# Patient Record
Sex: Male | Born: 1950 | Hispanic: No | Marital: Single | Smoking: Former smoker
Health system: Southern US, Community
[De-identification: ages and names within clinical notes are randomized; demographics above are authoritative.]

## PROBLEM LIST (undated history)

## (undated) DIAGNOSIS — J189 Pneumonia, unspecified organism: Secondary | ICD-10-CM

## (undated) DIAGNOSIS — G4733 Obstructive sleep apnea (adult) (pediatric): Secondary | ICD-10-CM

## (undated) DIAGNOSIS — I10 Essential (primary) hypertension: Secondary | ICD-10-CM

## (undated) DIAGNOSIS — G709 Myoneural disorder, unspecified: Secondary | ICD-10-CM

## (undated) DIAGNOSIS — F329 Major depressive disorder, single episode, unspecified: Secondary | ICD-10-CM

## (undated) DIAGNOSIS — H409 Unspecified glaucoma: Secondary | ICD-10-CM

## (undated) DIAGNOSIS — G4700 Insomnia, unspecified: Secondary | ICD-10-CM

## (undated) DIAGNOSIS — E782 Mixed hyperlipidemia: Secondary | ICD-10-CM

## (undated) DIAGNOSIS — Z9989 Dependence on other enabling machines and devices: Secondary | ICD-10-CM

## (undated) DIAGNOSIS — M109 Gout, unspecified: Secondary | ICD-10-CM

## (undated) DIAGNOSIS — M1990 Unspecified osteoarthritis, unspecified site: Secondary | ICD-10-CM

## (undated) DIAGNOSIS — E119 Type 2 diabetes mellitus without complications: Secondary | ICD-10-CM

## (undated) DIAGNOSIS — F32A Depression, unspecified: Secondary | ICD-10-CM

## (undated) HISTORY — DX: Essential (primary) hypertension: I10

## (undated) HISTORY — PX: SPLENECTOMY: SUR1306

## (undated) HISTORY — DX: Type 2 diabetes mellitus without complications: E11.9

## (undated) HISTORY — PX: GANGLION CYST EXCISION: SHX1691

## (undated) HISTORY — DX: Mixed hyperlipidemia: E78.2

## (undated) HISTORY — PX: TONSILLECTOMY: SUR1361

## (undated) HISTORY — DX: Insomnia, unspecified: G47.00

## (undated) HISTORY — PX: CHOLECYSTECTOMY: SHX55

## (undated) HISTORY — DX: Obstructive sleep apnea (adult) (pediatric): G47.33

## (undated) HISTORY — DX: Depression, unspecified: F32.A

## (undated) HISTORY — DX: Dependence on other enabling machines and devices: Z99.89

## (undated) HISTORY — DX: Major depressive disorder, single episode, unspecified: F32.9

---

## 1978-04-07 HISTORY — PX: GANGLION CYST EXCISION: SHX1691

## 2002-04-07 HISTORY — PX: BACK SURGERY: SHX140

## 2006-08-31 ENCOUNTER — Inpatient Hospital Stay (HOSPITAL_COMMUNITY): Admission: AC | Admit: 2006-08-31 | Discharge: 2006-09-05 | Payer: Self-pay
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
## 2010-04-07 HISTORY — PX: COLONOSCOPY: SHX174

## 2016-08-19 ENCOUNTER — Telehealth: Payer: Self-pay

## 2016-08-19 NOTE — Telephone Encounter (Signed)
[phone]  PATIENT RECEIVED LETTER TO SCHEDULE TCS

## 2016-08-27 ENCOUNTER — Telehealth: Payer: Self-pay

## 2016-08-27 NOTE — Telephone Encounter (Signed)
Pt had called last week to be triaged. He asked for DS to call this number instead. [phone]

## 2016-08-27 NOTE — Telephone Encounter (Signed)
I have scheduled pt an OV with Walden Field, NP on 09/24/2016 at 3:00 pm. He has a hx of tubular adenoma.  I have called Robin at the [place] and she will fax over authorization number.

## 2016-08-27 NOTE — Telephone Encounter (Signed)
See note of 08/27/2016.

## 2016-08-28 NOTE — Telephone Encounter (Signed)
Received the Authorization # [id number].  Having Brandon Park scan it in also.

## 2016-09-19 NOTE — Telephone Encounter (Signed)
Pt called in to talk DS I told him that she was off and he said that he needs to cancel his appointment for Wednesday. I told him that we don't have anything until August and he said to see if she could call him Monday.

## 2016-09-22 NOTE — Telephone Encounter (Signed)
LMOM for Robin at the [facility] that appt has been changed and to let me know if the previous authorization number is sufficient or do I need another one.

## 2016-09-22 NOTE — Telephone Encounter (Signed)
Pt has to go out of town to [employer] for some work. I have rescheduled him to 11/17/2016 at 2:00 pm with Roseanne Kaufman, NP.

## 2016-09-23 NOTE — Telephone Encounter (Signed)
LMOM again for Brandon Park about the appt change and if prior authorization number is sufficient.

## 2016-09-23 NOTE — Telephone Encounter (Signed)
Brandon Park returned call and said she will send me updated into to ensure the time is covered in his authorization.

## 2016-09-24 ENCOUNTER — Ambulatory Visit: Payer: Self-pay | Admitting: Nurse Practitioner

## 2016-09-24 NOTE — Telephone Encounter (Addendum)
Received the fax. Authorization number is the same. Covers 6 months, office visits up to 3 and the procedure.  Manuela Schwartz will scan this one in also.

## 2016-11-17 ENCOUNTER — Other Ambulatory Visit: Payer: Self-pay

## 2016-11-17 ENCOUNTER — Encounter: Payer: Self-pay | Admitting: Gastroenterology

## 2016-11-17 ENCOUNTER — Ambulatory Visit (INDEPENDENT_AMBULATORY_CARE_PROVIDER_SITE_OTHER): Payer: Non-veteran care | Admitting: Gastroenterology

## 2016-11-17 DIAGNOSIS — Z86010 Personal history of colonic polyps: Secondary | ICD-10-CM | POA: Diagnosis not present

## 2016-11-17 NOTE — Patient Instructions (Signed)
We have scheduled you for a colonoscopy with Dr. Oneida Alar in the near future.  Do not take metformin the day of the procedure.
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
## 2016-11-18 NOTE — Assessment & Plan Note (Signed)
66 year old male with history of adenoma in 2012 with recommendations for 5-year-surveillance per PCP notes. Requesting procedure notes if possible from outside facility. No concerning lower or upper GI symptoms.  Proceed with colonoscopy with Dr. Oneida Alar in the near future. The risks, benefits, and alternatives have been discussed in detail with the patient. They state understanding and desire to proceed.  Propofol due to polypharmacy  No metformin day of procedure

## 2016-11-18 NOTE — Progress Notes (Signed)
cc'ed to pcp °

## 2016-11-28 NOTE — Patient Instructions (Signed)
Brandon Park  11/28/2016     @PREFPERIOPPHARMACY @   Your procedure is scheduled on  12/09/2016   Report to [facility] at  745  A.M.  Call this number if you have problems the morning of surgery:  [phone]   Remember:  Do not eat food or drink liquids after midnight.  Take these medicines the morning of surgery with A SIP OF WATER  Lotensin, wellbutrin, metoprolol, zoloft.   Do not wear jewelry, make-up or nail polish.  Do not wear lotions, powders, or perfumes, or deoderant.  Do not shave 48 hours prior to surgery.  Men may shave face and neck.  Do not bring valuables to the hospital.  [facility] is not responsible for any belongings or valuables.  Contacts, dentures or bridgework may not be worn into surgery.  Leave your suitcase in the car.  After surgery it may be brought to your room.  For patients admitted to the hospital, discharge time will be determined by your treatment team.  Patients discharged the day of surgery will not be allowed to drive home.   Name and phone number of your driver:  family Special instructions:  Follow the diet and prep instructions given to you by Dr Nona Dell office.  Please read over the following fact sheets that you were given. Anesthesia Post-op Instructions and Care and Recovery After Surgery       Colonoscopy, Adult A colonoscopy is an exam to look at the entire large intestine. During the exam, a lubricated, bendable tube is inserted into the anus and then passed into the rectum, colon, and other parts of the large intestine. A colonoscopy is often done as a part of normal colorectal screening or in response to certain symptoms, such as anemia, persistent diarrhea, abdominal pain, and blood in the stool. The exam can help screen for and diagnose medical problems, including:  Tumors.  Polyps.  Inflammation.  Areas of bleeding.  Tell a health care provider about:  Any allergies you have.  All medicines  you are taking, including vitamins, herbs, eye drops, creams, and over-the-counter medicines.  Any problems you or family members have had with anesthetic medicines.  Any blood disorders you have.  Any surgeries you have had.  Any medical conditions you have.  Any problems you have had passing stool. What are the risks? Generally, this is a safe procedure. However, problems may occur, including:  Bleeding.  A tear in the intestine.  A reaction to medicines given during the exam.  Infection (rare).  What happens before the procedure? Eating and drinking restrictions Follow instructions from your health care provider about eating and drinking, which may include:  A few days before the procedure - follow a low-fiber diet. Avoid nuts, seeds, dried fruit, raw fruits, and vegetables.  1-3 days before the procedure - follow a clear liquid diet. Drink only clear liquids, such as clear broth or bouillon, black coffee or tea, clear juice, clear soft drinks or sports drinks, gelatin dessert, and popsicles. Avoid any liquids that contain red or purple dye.  On the day of the procedure - do not eat or drink anything during the 2 hours before the procedure, or within the time period that your health care provider recommends.  Bowel prep If you were prescribed an oral bowel prep to clean out your colon:  Take it as told by your health care provider. Starting the day before your procedure, you  will need to drink a large amount of medicated liquid. The liquid will cause you to have multiple loose stools until your stool is almost clear or light green.  If your skin or anus gets irritated from diarrhea, you may use these to relieve the irritation: ? Medicated wipes, such as adult wet wipes with aloe and vitamin E. ? A skin soothing-product like petroleum jelly.  If you vomit while drinking the bowel prep, take a break for up to 60 minutes and then begin the bowel prep again. If vomiting  continues and you cannot take the bowel prep without vomiting, call your health care provider.  General instructions  Ask your health care provider about changing or stopping your regular medicines. This is especially important if you are taking diabetes medicines or blood thinners.  Plan to have someone take you home from the hospital or clinic. What happens during the procedure?  An IV tube may be inserted into one of your veins.  You will be given medicine to help you relax (sedative).  To reduce your risk of infection: ? Your health care team will wash or sanitize their hands. ? Your anal area will be washed with soap.  You will be asked to lie on your side with your knees bent.  Your health care provider will lubricate a long, thin, flexible tube. The tube will have a camera and a light on the end.  The tube will be inserted into your anus.  The tube will be gently eased through your rectum and colon.  Air will be delivered into your colon to keep it open. You may feel some pressure or cramping.  The camera will be used to take images during the procedure.  A small tissue sample may be removed from your body to be examined under a microscope (biopsy). If any potential problems are found, the tissue will be sent to a lab for testing.  If small polyps are found, your health care provider may remove them and have them checked for cancer cells.  The tube that was inserted into your anus will be slowly removed. The procedure may vary among health care providers and hospitals. What happens after the procedure?  Your blood pressure, heart rate, breathing rate, and blood oxygen level will be monitored until the medicines you were given have worn off.  Do not drive for 24 hours after the exam.  You may have a small amount of blood in your stool.  You may pass gas and have mild abdominal cramping or bloating due to the air that was used to inflate your colon during the  exam.  It is up to you to get the results of your procedure. Ask your health care provider, or the department performing the procedure, when your results will be ready. This information is not intended to replace advice given to you by your health care provider. Make sure you discuss any questions you have with your health care provider. Document Released: 03/21/2000 Document Revised: 01/23/2016 Document Reviewed: 06/05/2015 Elsevier Interactive Patient Education  2018 [employer].  Colonoscopy, Adult, Care After This sheet gives you information about how to care for yourself after your procedure. Your health care provider may also give you more specific instructions. If you have problems or questions, contact your health care provider. What can I expect after the procedure? After the procedure, it is common to have:  A small amount of blood in your stool for 24 hours after the procedure.  Some  gas.  Mild abdominal cramping or bloating.  Follow these instructions at home: General instructions   For the first 24 hours after the procedure: ? Do not drive or use machinery. ? Do not sign important documents. ? Do not drink alcohol. ? Do your regular daily activities at a slower pace than normal. ? Eat soft, easy-to-digest foods. ? Rest often.  Take over-the-counter or prescription medicines only as told by your health care provider.  It is up to you to get the results of your procedure. Ask your health care provider, or the department performing the procedure, when your results will be ready. Relieving cramping and bloating  Try walking around when you have cramps or feel bloated.  Apply heat to your abdomen as told by your health care provider. Use a heat source that your health care provider recommends, such as a moist heat pack or a heating pad. ? Place a towel between your skin and the heat source. ? Leave the heat on for 20-30 minutes. ? Remove the heat if your skin turns  bright red. This is especially important if you are unable to feel pain, heat, or cold. You may have a greater risk of getting burned. Eating and drinking  Drink enough fluid to keep your urine clear or pale yellow.  Resume your normal diet as instructed by your health care provider. Avoid heavy or fried foods that are hard to digest.  Avoid drinking alcohol for as long as instructed by your health care provider. Contact a health care provider if:  You have blood in your stool 2-3 days after the procedure. Get help right away if:  You have more than a small spotting of blood in your stool.  You pass large blood clots in your stool.  Your abdomen is swollen.  You have nausea or vomiting.  You have a fever.  You have increasing abdominal pain that is not relieved with medicine. This information is not intended to replace advice given to you by your health care provider. Make sure you discuss any questions you have with your health care provider. Document Released: 11/06/2003 Document Revised: 12/17/2015 Document Reviewed: 06/05/2015 Elsevier Interactive Patient Education  2018 [place] Anesthesia is a term that refers to techniques, procedures, and medicines that help a person stay safe and comfortable during a medical procedure. Monitored anesthesia care, or sedation, is one type of anesthesia. Your anesthesia specialist may recommend sedation if you will be having a procedure that does not require you to be unconscious, such as:  Cataract surgery.  A dental procedure.  A biopsy.  A colonoscopy.  During the procedure, you may receive a medicine to help you relax (sedative). There are three levels of sedation:  Mild sedation. At this level, you may feel awake and relaxed. You will be able to follow directions.  Moderate sedation. At this level, you will be sleepy. You may not remember the procedure.  Deep sedation. At this level, you will be  asleep. You will not remember the procedure.  The more medicine you are given, the deeper your level of sedation will be. Depending on how you respond to the procedure, the anesthesia specialist may change your level of sedation or the type of anesthesia to fit your needs. An anesthesia specialist will monitor you closely during the procedure. Let your health care provider know about:  Any allergies you have.  All medicines you are taking, including vitamins, herbs, eye drops, creams, and over-the-counter  medicines.  Any use of steroids (by mouth or as a cream).  Any problems you or family members have had with sedatives and anesthetic medicines.  Any blood disorders you have.  Any surgeries you have had.  Any medical conditions you have, such as sleep apnea.  Whether you are pregnant or may be pregnant.  Any use of cigarettes, alcohol, or street drugs. What are the risks? Generally, this is a safe procedure. However, problems may occur, including:  Getting too much medicine (oversedation).  Nausea.  Allergic reaction to medicines.  Trouble breathing. If this happens, a breathing tube may be used to help with breathing. It will be removed when you are awake and breathing on your own.  Heart trouble.  Lung trouble.  Before the procedure Staying hydrated Follow instructions from your health care provider about hydration, which may include:  Up to 2 hours before the procedure - you may continue to drink clear liquids, such as water, clear fruit juice, black coffee, and plain tea.  Eating and drinking restrictions Follow instructions from your health care provider about eating and drinking, which may include:  8 hours before the procedure - stop eating heavy meals or foods such as meat, fried foods, or fatty foods.  6 hours before the procedure - stop eating light meals or foods, such as toast or cereal.  6 hours before the procedure - stop drinking milk or drinks that  contain milk.  2 hours before the procedure - stop drinking clear liquids.  Medicines Ask your health care provider about:  Changing or stopping your regular medicines. This is especially important if you are taking diabetes medicines or blood thinners.  Taking medicines such as aspirin and ibuprofen. These medicines can thin your blood. Do not take these medicines before your procedure if your health care provider instructs you not to.  Tests and exams  You will have a physical exam.  You may have blood tests done to show: ? How well your kidneys and liver are working. ? How well your blood can clot.  General instructions  Plan to have someone take you home from the hospital or clinic.  If you will be going home right after the procedure, plan to have someone with you for 24 hours.  What happens during the procedure?  Your blood pressure, heart rate, breathing, level of pain and overall condition will be monitored.  An IV tube will be inserted into one of your veins.  Your anesthesia specialist will give you medicines as needed to keep you comfortable during the procedure. This may mean changing the level of sedation.  The procedure will be performed. After the procedure  Your blood pressure, heart rate, breathing rate, and blood oxygen level will be monitored until the medicines you were given have worn off.  Do not drive for 24 hours if you received a sedative.  You may: ? Feel sleepy, clumsy, or nauseous. ? Feel forgetful about what happened after the procedure. ? Have a sore throat if you had a breathing tube during the procedure. ? Vomit. This information is not intended to replace advice given to you by your health care provider. Make sure you discuss any questions you have with your health care provider. Document Released: 12/18/2004 Document Revised: 08/31/2015 Document Reviewed: 07/15/2015 Elsevier Interactive Patient Education  2018 [place], Care After These instructions provide you with information about caring for yourself after your procedure. Your health care provider may also give  you more specific instructions. Your treatment has been planned according to current medical practices, but problems sometimes occur. Call your health care provider if you have any problems or questions after your procedure. What can I expect after the procedure? After your procedure, it is common to:  Feel sleepy for several hours.  Feel clumsy and have poor balance for several hours.  Feel forgetful about what happened after the procedure.  Have poor judgment for several hours.  Feel nauseous or vomit.  Have a sore throat if you had a breathing tube during the procedure.  Follow these instructions at home: For at least 24 hours after the procedure:   Do not: ? Participate in activities in which you could fall or become injured. ? Drive. ? Use heavy machinery. ? Drink alcohol. ? Take sleeping pills or medicines that cause drowsiness. ? Make important decisions or sign legal documents. ? Take care of children on your own.  Rest. Eating and drinking  Follow the diet that is recommended by your health care provider.  If you vomit, drink water, juice, or soup when you can drink without vomiting.  Make sure you have little or no nausea before eating solid foods. General instructions  Have a responsible adult stay with you until you are awake and alert.  Take over-the-counter and prescription medicines only as told by your health care provider.  If you smoke, do not smoke without supervision.  Keep all follow-up visits as told by your health care provider. This is important. Contact a health care provider if:  You keep feeling nauseous or you keep vomiting.  You feel light-headed.  You develop a rash.  You have a fever. Get help right away if:  You have trouble breathing. This information is not  intended to replace advice given to you by your health care provider. Make sure you discuss any questions you have with your health care provider. Document Released: 07/15/2015 Document Revised: 11/14/2015 Document Reviewed: 07/15/2015 Elsevier Interactive Patient Education  [employer].

## 2016-12-03 ENCOUNTER — Encounter (HOSPITAL_COMMUNITY): Payer: Self-pay

## 2016-12-03 ENCOUNTER — Encounter (HOSPITAL_COMMUNITY)
Admission: RE | Admit: 2016-12-03 | Discharge: 2016-12-03 | Disposition: A | Discharge status: Home / Self Care | Payer: Non-veteran care | Source: Ambulatory Visit | Attending: Gastroenterology | Admitting: Gastroenterology

## 2016-12-03 ENCOUNTER — Telehealth: Payer: Self-pay | Admitting: Gastroenterology

## 2016-12-03 DIAGNOSIS — R9431 Abnormal electrocardiogram [ECG] [EKG]: Secondary | ICD-10-CM | POA: Diagnosis not present

## 2016-12-03 DIAGNOSIS — Z01818 Encounter for other preprocedural examination: Secondary | ICD-10-CM | POA: Diagnosis present

## 2016-12-03 HISTORY — DX: Unspecified osteoarthritis, unspecified site: M19.90

## 2016-12-03 HISTORY — DX: Unspecified glaucoma: H40.9

## 2016-12-03 HISTORY — DX: Gout, unspecified: M10.9

## 2016-12-03 LAB — CBC WITH DIFFERENTIAL/PLATELET
Basophils Absolute: 0 10*3/uL (ref 0.0–0.1)
Basophils Relative: 0 %
EOS PCT: 2 %
Eosinophils Absolute: 0.2 10*3/uL (ref 0.0–0.7)
HCT: 46.7 % (ref 39.0–52.0)
Hemoglobin: 14.2 g/dL (ref 13.0–17.0)
LYMPHS ABS: 3.6 10*3/uL (ref 0.7–4.0)
Lymphocytes Relative: 32 %
MCH: 29.5 pg (ref 26.0–34.0)
MCHC: 30.4 g/dL (ref 30.0–36.0)
MCV: 96.9 fL (ref 78.0–100.0)
MONOS PCT: 8 %
Monocytes Absolute: 0.9 10*3/uL (ref 0.1–1.0)
Neutro Abs: 6.4 10*3/uL (ref 1.7–7.7)
Neutrophils Relative %: 58 %
PLATELETS: 266 10*3/uL (ref 150–400)
RBC: 4.82 MIL/uL (ref 4.22–5.81)
RDW: 14 % (ref 11.5–15.5)
WBC: 11.1 10*3/uL — AB (ref 4.0–10.5)

## 2016-12-03 LAB — BASIC METABOLIC PANEL
Anion gap: 12 (ref 5–15)
BUN: 13 mg/dL (ref 6–20)
CHLORIDE: 102 mmol/L (ref 101–111)
CO2: 24 mmol/L (ref 22–32)
Calcium: 9.5 mg/dL (ref 8.9–10.3)
Creatinine, Ser: 1.21 mg/dL (ref 0.61–1.24)
GFR calc Af Amer: >60 mL/min (ref >60)
GLUCOSE: 188 mg/dL — AB (ref 65–99)
POTASSIUM: 3.2 mmol/L — AB (ref 3.5–5.1)
Sodium: 138 mmol/L (ref 135–145)

## 2016-12-03 MED ORDER — POTASSIUM CHLORIDE ER 20 MEQ PO TBCR: EXTENDED_RELEASE_TABLET | ORAL | 0 refills | Status: DC

## 2016-12-03 NOTE — Telephone Encounter (Signed)
PLEASE CALL PT. HE HAS A LOW POTASSIUM. RX SENT FOR KCL 40 mEq BID ON AUG 30 THEN 40 MEq DAILY FOR FOR 3 DAYS.RESUME HIS ROUTINE KCL MEDS ON SEP 4. HIS ECG SUGGESTS HE MAY HAVE CORONARY ARTERY DISEASE. HE SHOULD SEE A HEART DOCTOR TO DISCUSS THE CHANGES ON HIS ECG.

## 2016-12-04 NOTE — Telephone Encounter (Signed)
Pt is aware to start the K+ 40 meq today and continue for 3 days. [facility]

## 2016-12-08 ENCOUNTER — Telehealth: Payer: Self-pay | Admitting: Gastroenterology

## 2016-12-08 NOTE — Telephone Encounter (Signed)
PT AWARE[facility].

## 2016-12-09 ENCOUNTER — Encounter (HOSPITAL_COMMUNITY): Payer: Self-pay | Admitting: *Deleted

## 2016-12-09 ENCOUNTER — Ambulatory Visit (HOSPITAL_COMMUNITY): Payer: Non-veteran care | Admitting: Anesthesiology

## 2016-12-09 ENCOUNTER — Ambulatory Visit (HOSPITAL_COMMUNITY)
Admission: RE | Admit: 2016-12-09 | Discharge: 2016-12-09 | Disposition: A | Discharge status: Home / Self Care | Payer: Non-veteran care | Source: Ambulatory Visit | Attending: Gastroenterology | Admitting: Gastroenterology

## 2016-12-09 ENCOUNTER — Encounter (HOSPITAL_COMMUNITY)
Admission: RE | Disposition: A | Discharge status: Home / Self Care | Payer: Self-pay | Source: Ambulatory Visit | Attending: Gastroenterology

## 2016-12-09 DIAGNOSIS — K648 Other hemorrhoids: Secondary | ICD-10-CM | POA: Insufficient documentation

## 2016-12-09 DIAGNOSIS — D124 Benign neoplasm of descending colon: Secondary | ICD-10-CM

## 2016-12-09 DIAGNOSIS — K5730 Diverticulosis of large intestine without perforation or abscess without bleeding: Secondary | ICD-10-CM | POA: Insufficient documentation

## 2016-12-09 DIAGNOSIS — Z7984 Long term (current) use of oral hypoglycemic drugs: Secondary | ICD-10-CM | POA: Insufficient documentation

## 2016-12-09 DIAGNOSIS — M109 Gout, unspecified: Secondary | ICD-10-CM | POA: Insufficient documentation

## 2016-12-09 DIAGNOSIS — I10 Essential (primary) hypertension: Secondary | ICD-10-CM | POA: Diagnosis not present

## 2016-12-09 DIAGNOSIS — Z87891 Personal history of nicotine dependence: Secondary | ICD-10-CM | POA: Insufficient documentation

## 2016-12-09 DIAGNOSIS — E7800 Pure hypercholesterolemia, unspecified: Secondary | ICD-10-CM | POA: Insufficient documentation

## 2016-12-09 DIAGNOSIS — Z1211 Encounter for screening for malignant neoplasm of colon: Secondary | ICD-10-CM | POA: Insufficient documentation

## 2016-12-09 DIAGNOSIS — Z7982 Long term (current) use of aspirin: Secondary | ICD-10-CM | POA: Diagnosis not present

## 2016-12-09 DIAGNOSIS — F329 Major depressive disorder, single episode, unspecified: Secondary | ICD-10-CM | POA: Diagnosis not present

## 2016-12-09 DIAGNOSIS — E119 Type 2 diabetes mellitus without complications: Secondary | ICD-10-CM | POA: Insufficient documentation

## 2016-12-09 DIAGNOSIS — Z8371 Family history of colonic polyps: Secondary | ICD-10-CM | POA: Diagnosis not present

## 2016-12-09 DIAGNOSIS — G4700 Insomnia, unspecified: Secondary | ICD-10-CM | POA: Diagnosis not present

## 2016-12-09 DIAGNOSIS — K644 Residual hemorrhoidal skin tags: Secondary | ICD-10-CM | POA: Insufficient documentation

## 2016-12-09 DIAGNOSIS — Z86010 Personal history of colonic polyps: Secondary | ICD-10-CM | POA: Insufficient documentation

## 2016-12-09 DIAGNOSIS — D123 Benign neoplasm of transverse colon: Secondary | ICD-10-CM | POA: Diagnosis not present

## 2016-12-09 HISTORY — PX: COLONOSCOPY WITH PROPOFOL: SHX5780

## 2016-12-09 HISTORY — PX: POLYPECTOMY: SHX5525

## 2016-12-09 LAB — GLUCOSE, CAPILLARY
GLUCOSE-CAPILLARY: 176 mg/dL — AB (ref 65–99)
GLUCOSE-CAPILLARY: 163 mg/dL — AB (ref 65–99)

## 2016-12-09 SURGERY — COLONOSCOPY WITH PROPOFOL
Anesthesia: Monitor Anesthesia Care

## 2016-12-09 MED ORDER — MIDAZOLAM HCL 2 MG/2ML IJ SOLN
1.0000 mg | INTRAMUSCULAR | Status: AC
  Administered 2016-12-09: 2 mg via INTRAVENOUS

## 2016-12-09 MED ORDER — CHLORHEXIDINE GLUCONATE CLOTH 2 % EX PADS: 6.0000 | MEDICATED_PAD | Freq: Once | CUTANEOUS | Status: DC

## 2016-12-09 MED ORDER — LACTATED RINGERS IV SOLN
INTRAVENOUS | Status: DC
  Administered 2016-12-09: 09:00:00 via INTRAVENOUS

## 2016-12-09 MED ORDER — FENTANYL CITRATE (PF) 100 MCG/2ML IJ SOLN
25.0000 ug | Freq: Once | INTRAMUSCULAR | Status: AC
  Administered 2016-12-09: 25 ug via INTRAVENOUS

## 2016-12-09 MED ORDER — PROPOFOL 500 MG/50ML IV EMUL
INTRAVENOUS | Status: DC | PRN
  Administered 2016-12-09: 150 ug/kg/min via INTRAVENOUS
  Administered 2016-12-09: 09:00:00 via INTRAVENOUS

## 2016-12-09 MED ORDER — FENTANYL CITRATE (PF) 100 MCG/2ML IJ SOLN
INTRAMUSCULAR | Status: AC
  Filled 2016-12-09: qty 2

## 2016-12-09 MED ORDER — STERILE WATER FOR IRRIGATION IR SOLN
Status: DC | PRN
  Administered 2016-12-09: 100 mL

## 2016-12-09 MED ORDER — MIDAZOLAM HCL 2 MG/2ML IJ SOLN
INTRAMUSCULAR | Status: AC
  Filled 2016-12-09: qty 2

## 2016-12-09 NOTE — Anesthesia Postprocedure Evaluation (Signed)
Anesthesia Post Note  Patient: Brandon Park  Procedure(s) Performed: Procedure(s) (LRB): COLONOSCOPY WITH PROPOFOL (N/A) POLYPECTOMY  Patient location during evaluation: PACU Anesthesia Type: MAC Level of consciousness: awake and alert, oriented and patient cooperative Pain management: pain level controlled Vital Signs Assessment: post-procedure vital signs reviewed and stable Respiratory status: spontaneous breathing Cardiovascular status: stable Postop Assessment: no signs of nausea or vomiting Anesthetic complications: no     Last Vitals:  Vitals:   12/09/16 0855 12/09/16 0900  BP: (!) 141/84 (!) 143/87  Pulse:    Resp: 17 13  Temp:    SpO2: 95% 94%    Last Pain:  Vitals:   12/09/16 0822  TempSrc: Oral                 [name] A

## 2016-12-09 NOTE — Transfer of Care (Signed)
Immediate Anesthesia Transfer of Care Note  Patient: Brandon Park  Procedure(s) Performed: Procedure(s) with comments: COLONOSCOPY WITH PROPOFOL (N/A) - 915 POLYPECTOMY - transverse colon polyp, descending colon polyps x2  Patient Location: PACU  Anesthesia Type:MAC  Level of Consciousness: awake, alert , oriented and patient cooperative  Airway & Oxygen Therapy: Patient Spontanous Breathing  Post-op Assessment: Report given to RN and Post -op Vital signs reviewed and stable  Post vital signs: Reviewed and stable  Last Vitals:  Vitals:   12/09/16 0855 12/09/16 0900  BP: (!) 141/84 (!) 143/87  Pulse:    Resp: 17 13  Temp:    SpO2: 95% 94%    Last Pain:  Vitals:   12/09/16 0822  TempSrc: Oral      Patients Stated Pain Goal: 6 (97/67/34 1937)  Complications: No apparent anesthesia complications
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
## 2016-12-09 NOTE — Op Note (Signed)
[facility] Patient Name: Brandon Park Procedure Date: 12/09/2016 8:48 AM MRN: [id number] Date of Birth: 1951/01/27 Attending MD: Barney Drain MD, MD CSN: [id number] Age: 66 Admit Type: Outpatient Procedure:                Colonoscopy with cold snare AND COLD FORCEPS                            POLYPECTOMY Indications:              High risk colon cancer surveillance: Personal                            history of colonic polyps Providers:                Barney Drain MD, MD, Rosina Lowenstein, RN, Aram Candela Referring MD:             [facility] Medicines:                Propofol per Anesthesia Complications:            No immediate complications. Estimated Blood Loss:     Estimated blood loss was minimal. Procedure:                Pre-Anesthesia Assessment:                           - Prior to the procedure, a History and Physical                            was performed, and patient medications and                            allergies were reviewed. The patient's tolerance of                            previous anesthesia was also reviewed. The risks                            and benefits of the procedure and the sedation                            options and risks were discussed with the patient.                            All questions were answered, and informed consent                            was obtained. Prior Anticoagulants: The patient                            last took aspirin 1 day prior to the procedure. ASA                            Grade Assessment: II - A patient with mild systemic  disease. After reviewing the risks and benefits,                            the patient was deemed in satisfactory condition to                            undergo the procedure. After obtaining informed                            consent, the colonoscope was passed under direct                            vision. Throughout the procedure, the patient's                             blood pressure, pulse, and oxygen saturations were                            monitored continuously. The EC-[id number] ([id number])                            scope was introduced through the anus and advanced                            to the the cecum, identified by appendiceal orifice                            and ileocecal valve. The colonoscopy was somewhat                            difficult due to a tortuous colon. Successful                            completion of the procedure was aided by COLOWRAP.                            The patient tolerated the procedure well. The                            quality of the bowel preparation was good. The                            ileocecal valve, appendiceal orifice, and rectum                            were photographed. Scope In: 9:16:39 AM Scope Out: 9:40:03 AM Scope Withdrawal Time: 0 hours 20 minutes 33 seconds  Total Procedure Duration: 0 hours 23 minutes 24 seconds  Findings:      A 3 mm polyp was found in the mid transverse colon. The polyp was       sessile. The polyp was removed with a cold biopsy forceps. Resection and       retrieval were complete.      Three sessile polyps were found in the  proximal descending colon. The       polyps were 4 to 6 mm in size. These polyps were removed with a cold       snare. Resection and retrieval were complete. Coagulation for hemostasis       of bleeding caused by the procedure using snare was successful.      Multiple small and large-mouthed diverticula were found in the sigmoid       colon, descending colon and ascending colon.      External and internal hemorrhoids were found during retroflexion. The       hemorrhoids were moderate. Impression:               - One 3 mm polyp in the mid transverse colon,                            removed with a cold biopsy forceps. Resected and                            retrieved.                           - Three 4 to 6 mm  polyps in the proximal descending                            colon, removed with a cold snare. Resected and                            retrieved. Treated with a hot snare.                           - Diverticulosis in the sigmoid colon, in the                            descending colon and in the ascending colon.                           - External and internal hemorrhoids. Moderate Sedation:      Per Anesthesia Care Recommendation:           - Repeat colonoscopy in 3 years for surveillance.                           - High fiber diet.                           - Continue present medications.                           - Await pathology results.                           - Patient has a contact number available for                            emergencies. The signs and symptoms of potential  delayed complications were discussed with the                            patient. Return to normal activities tomorrow.                            Written discharge instructions were provided to the                            patient. Procedure Code(s):        --- Professional ---                           [phone], Colonoscopy, flexible; with removal of                            tumor(s), polyp(s), or other lesion(s) by snare                            technique                           45380, 63, Colonoscopy, flexible; with biopsy,                            single or multiple Diagnosis Code(s):        --- Professional ---                           Z86.010, Personal history of colonic polyps                           D12.3, Benign neoplasm of transverse colon (hepatic                            flexure or splenic flexure)                           D12.4, Benign neoplasm of descending colon                           K64.8, Other hemorrhoids                           K57.30, Diverticulosis of large intestine without                            perforation or abscess without  bleeding CPT copyright 2016 [employer]. All rights reserved. The codes documented in this report are preliminary and upon coder review may  be revised to meet current compliance requirements. Barney Drain, MD Barney Drain MD, MD 12/09/2016 9:59:56 AM This report has been signed electronically. Number of Addenda: 0

## 2016-12-09 NOTE — Anesthesia Procedure Notes (Signed)
Procedure Name: MAC Date/Time: 12/09/2016 9:05 AM Performed by: Andree Elk, Haleem Hanner A Pre-anesthesia Checklist: Patient identified, Emergency Drugs available, Suction available, Patient being monitored and Timeout performed Oxygen Delivery Method: Simple face mask

## 2016-12-09 NOTE — Anesthesia Preprocedure Evaluation (Signed)
Anesthesia Evaluation  Patient identified by MRN, date of birth, ID band Patient awake    Reviewed: Allergy & Precautions, NPO status , Patient's Chart, lab work & pertinent test results  Airway Mallampati: I  TM Distance: >3 FB Neck ROM: Full    Dental  (+) Teeth Intact   Pulmonary former smoker,    breath sounds clear to auscultation       Cardiovascular hypertension, Pt. on medications  Rhythm:Regular Rate:Normal     Neuro/Psych PSYCHIATRIC DISORDERS Depression negative neurological ROS     GI/Hepatic   Endo/Other  diabetes, Type 2  Renal/GU      Musculoskeletal  (+) Arthritis ,   Abdominal   Peds  Hematology   Anesthesia Other Findings   Reproductive/Obstetrics                             Anesthesia Physical Anesthesia Plan  ASA: II  Anesthesia Plan: MAC   Post-op Pain Management:    Induction: Intravenous  PONV Risk Score and Plan:   Airway Management Planned: Simple Face Mask  Additional Equipment:   Intra-op Plan:   Post-operative Plan:   Informed Consent: I have reviewed the patients History and Physical, chart, labs and discussed the procedure including the risks, benefits and alternatives for the proposed anesthesia with the patient or authorized representative who has indicated his/her understanding and acceptance.     Plan Discussed with:   Anesthesia Plan Comments:         Anesthesia Quick Evaluation

## 2016-12-09 NOTE — Discharge Instructions (Signed)
You have MODERATE internal AND EXTERNAL hemorrhoids. YOU HAVE diverticulosis IN YOUR RIGHT AND LEFT COLON. YOU HAD FOUR POLYPS REMOVED.    DRINK WATER TO KEEP YOUR URINE LIGHT YELLOW.  FOLLOW A HIGH FIBER DIET. AVOID ITEMS THAT CAUSE BLOATING. See info below.  CONTINUE YOUR WEIGHT LOSS EFFORTS. LOSE TWENTY POUNDS.  WHILE I DO NOT WANT TO ALARM YOU, YOUR BODY MASS INDEX (BMI) IS OVER 30 WHICH MEANS YOU ARE OBESE. OBESITY ACTIVATES CANCER GENES. OBESITY IS ASSOCIATED WITH AN INCREASE RISK FOR ALL CANCERS, INCLUDING ESOPHAGEAL AND COLON CANCER. A WEIGHT OF 200 LBS WILL GET YOUR BM LESS THAN 30.   USE PREPARATION H FOUR TIMES  A DAY IF NEEDED TO RELIEVE RECTAL PAIN/PRESSURE/BLEEDING. RESTRICT USE OF HYDROCORTISONE SUPPOSITORIES TO 3-4 TIMES A YEAR. IF CREAMS AND SUPPOSITORIES CANNOT CONTROL YOUR SYMPTOMS, YOU SHOULD CONSIDER SURGERY TO FIX YOUR HEMORRHOIDS.  YOUR BIOPSY RESULTS WILL BE AVAILABLE IN MY CHART  SEP 7 AND MY OFFICE WILL CONTACT YOU IN 10-14 DAYS WITH YOUR RESULTS.   Next colonoscopy in 3 years.  Colonoscopy Care After Read the instructions outlined below and refer to this sheet in the next week. These discharge instructions provide you with general information on caring for yourself after you leave the hospital. While your treatment has been planned according to the most current medical practices available, unavoidable complications occasionally occur. If you have any problems or questions after discharge, call DR. Dashanique Brownstein, [phone].  ACTIVITY  You may resume your regular activity, but move at a slower pace for the next 24 hours.   Take frequent rest periods for the next 24 hours.   Walking will help get rid of the air and reduce the bloated feeling in your belly (abdomen).   No driving for 24 hours (because of the medicine (anesthesia) used during the test).   You may shower.   Do not sign any important legal documents or operate any machinery for 24 hours (because of the  anesthesia used during the test).    NUTRITION  Drink plenty of fluids.   You may resume your normal diet as instructed by your doctor.   Begin with a light meal and progress to your normal diet. Heavy or fried foods are harder to digest and may make you feel sick to your stomach (nauseated).   Avoid alcoholic beverages for 24 hours or as instructed.    MEDICATIONS  You may resume your normal medications.   WHAT YOU CAN EXPECT TODAY  Some feelings of bloating in the abdomen.   Passage of more gas than usual.   Spotting of blood in your stool or on the toilet paper  .  IF YOU HAD POLYPS REMOVED DURING THE COLONOSCOPY:  Eat a soft diet IF YOU HAVE NAUSEA, BLOATING, ABDOMINAL PAIN, OR VOMITING.    FINDING OUT THE RESULTS OF YOUR TEST Not all test results are available during your visit. DR. Oneida Alar WILL CALL YOU WITHIN 14 DAYS OF YOUR PROCEDUE WITH YOUR RESULTS. Do not assume everything is normal if you have not heard from DR. Jordanna Hendrie, CALL HER OFFICE AT [phone].  SEEK IMMEDIATE MEDICAL ATTENTION AND CALL THE OFFICE: [phone] IF:  You have more than a spotting of blood in your stool.   Your belly is swollen (abdominal distention).   You are nauseated or vomiting.   You have a temperature over 101F.   You have abdominal pain or discomfort that is severe or gets worse throughout the day.  High-Fiber Diet A high-fiber  diet changes your normal diet to include more whole grains, legumes, fruits, and vegetables. Changes in the diet involve replacing refined carbohydrates with unrefined foods. The calorie level of the diet is essentially unchanged. The Dietary Reference Intake (recommended amount) for adult males is 38 grams per day. For adult females, it is 25 grams per day. Pregnant and lactating women should consume 28 grams of fiber per day. Fiber is the intact part of a plant that is not broken down during digestion. Functional fiber is fiber that has been isolated  from the plant to provide a beneficial effect in the body. PURPOSE  Increase stool bulk.   Ease and regulate bowel movements.   Lower cholesterol.  REDUCE RISK OF COLON CANCER  INDICATIONS THAT YOU NEED MORE FIBER  Constipation and hemorrhoids.   Uncomplicated diverticulosis (intestine condition) and irritable bowel syndrome.   Weight management.   As a protective measure against hardening of the arteries (atherosclerosis), diabetes, and cancer.   GUIDELINES FOR INCREASING FIBER IN THE DIET  Start adding fiber to the diet slowly. A gradual increase of about 5 more grams (2 slices of whole-wheat bread, 2 servings of most fruits or vegetables, or 1 bowl of high-fiber cereal) per day is best. Too rapid an increase in fiber may result in constipation, flatulence, and bloating.   Drink enough water and fluids to keep your urine clear or pale yellow. Water, juice, or caffeine-free drinks are recommended. Not drinking enough fluid may cause constipation.   Eat a variety of high-fiber foods rather than one type of fiber.   Try to increase your intake of fiber through using high-fiber foods rather than fiber pills or supplements that contain small amounts of fiber.   The goal is to change the types of food eaten. Do not supplement your present diet with high-fiber foods, but replace foods in your present diet.   INCLUDE A VARIETY OF FIBER SOURCES  Replace refined and processed grains with whole grains, canned fruits with fresh fruits, and incorporate other fiber sources. White rice, white breads, and most bakery goods contain little or no fiber.   Brown whole-grain rice, buckwheat oats, and many fruits and vegetables are all good sources of fiber. These include: broccoli, Brussels sprouts, cabbage, cauliflower, beets, sweet potatoes, white potatoes (skin on), carrots, tomatoes, eggplant, squash, berries, fresh fruits, and dried fruits.   Cereals appear to be the richest source of fiber.  Cereal fiber is found in whole grains and bran. Bran is the fiber-rich outer coat of cereal grain, which is largely removed in refining. In whole-grain cereals, the bran remains. In breakfast cereals, the largest amount of fiber is found in those with "bran" in their names. The fiber content is sometimes indicated on the label.   You may need to include additional fruits and vegetables each day.   In baking, for 1 cup white flour, you may use the following substitutions:   1 cup whole-wheat flour minus 2 tablespoons.   1/2 cup white flour plus 1/2 cup whole-wheat flour.   Polyps, Colon  A polyp is extra tissue that grows inside your body. Colon polyps grow in the large intestine. The large intestine, also called the colon, is part of your digestive system. It is a long, hollow tube at the end of your digestive tract where your body makes and stores stool. Most polyps are not dangerous. They are benign. This means they are not cancerous. But over time, some types of polyps can turn into cancer.  Polyps that are smaller than a pea are usually not harmful. But larger polyps could someday become or may already be cancerous. To be safe, doctors remove all polyps and test them.   PREVENTION There is not one sure way to prevent polyps. You might be able to lower your risk of getting them if you:  Eat more fruits and vegetables and less fatty food.   Do not smoke.   Avoid alcohol.   Exercise every day.   Lose weight if you are overweight.   Eating more calcium and folate can also lower your risk of getting polyps. Some foods that are rich in calcium are milk, cheese, and broccoli. Some foods that are rich in folate are chickpeas, kidney beans, and spinach.    Diverticulosis Diverticulosis is a common condition that develops when small pouches (diverticula) form in the wall of the colon. The risk of diverticulosis increases with age. It happens more often in people who eat a low-fiber diet. Most  individuals with diverticulosis have no symptoms. Those individuals with symptoms usually experience belly (abdominal) pain, constipation, or loose stools (diarrhea).  HOME CARE INSTRUCTIONS  Increase the amount of fiber in your diet as directed by your caregiver or dietician. This may reduce symptoms of diverticulosis.   Drink at least 6 to 8 glasses of water each day to prevent constipation.   Try not to strain when you have a bowel movement.   Avoiding nuts and seeds to prevent complications is NOT NECESSARY.     FOODS HAVING HIGH FIBER CONTENT INCLUDE:  Fruits. Apple, peach, pear, tangerine, raisins, prunes.   Vegetables. Brussels sprouts, asparagus, broccoli, cabbage, carrot, cauliflower, romaine lettuce, spinach, summer squash, tomato, winter squash, zucchini.   Starchy Vegetables. Baked beans, kidney beans, lima beans, split peas, lentils, potatoes (with skin).   Grains. Whole wheat bread, brown rice, bran flake cereal, plain oatmeal, white rice, shredded wheat, bran muffins.    SEEK IMMEDIATE MEDICAL CARE IF:  You develop increasing pain or severe bloating.   You have an oral temperature above 101F.   You develop vomiting or bowel movements that are bloody or black.   Hemorrhoids Hemorrhoids are dilated (enlarged) veins around the rectum. Sometimes clots will form in the veins. This makes them swollen and painful. These are called thrombosed hemorrhoids. Causes of hemorrhoids include:  Constipation.   Straining to have a bowel movement.   HEAVY LIFTING  HOME CARE INSTRUCTIONS  Eat a well balanced diet and drink 6 to 8 glasses of water every day to avoid constipation. You may also use a bulk laxative.   Avoid straining to have bowel movements.   Keep anal area dry and clean.   Do not use a donut shaped pillow or sit on the toilet for long periods. This increases blood pooling and pain.   Move your bowels when your body has the urge; this will require less  straining and will decrease pain and pressure.

## 2016-12-09 NOTE — Telephone Encounter (Signed)
cc'ed to pcp °

## 2016-12-10 ENCOUNTER — Telehealth: Payer: Self-pay | Admitting: Gastroenterology

## 2016-12-10 NOTE — Telephone Encounter (Signed)
Please call pt. He had FOUR simple adenomas removed.   DRINK WATER TO KEEP YOUR URINE LIGHT YELLOW.  FOLLOW A HIGH FIBER DIET. AVOID ITEMS THAT CAUSE BLOATING. See info below.  CONTINUE YOUR WEIGHT LOSS EFFORTS. A WEIGHT OF 200 LBS WILL GET YOUR BM LESS THAN 30.  USE PREPARATION H FOUR TIMES  A DAY IF NEEDED TO RELIEVE RECTAL PAIN/PRESSURE/BLEEDING.   Next colonoscopy in 3 years.

## 2016-12-11 NOTE — Telephone Encounter (Signed)
Pt is aware.  

## 2016-12-11 NOTE — Telephone Encounter (Signed)
Reminder in epic and cc'ed to pcp °

## 2016-12-12 ENCOUNTER — Encounter (HOSPITAL_COMMUNITY): Payer: Self-pay | Admitting: Gastroenterology

## 2017-01-29 DIAGNOSIS — M11272 Other chondrocalcinosis, left ankle and foot: Secondary | ICD-10-CM | POA: Diagnosis not present

## 2017-05-28 DIAGNOSIS — Z87891 Personal history of nicotine dependence: Secondary | ICD-10-CM | POA: Diagnosis not present

## 2017-05-28 DIAGNOSIS — F332 Major depressive disorder, recurrent severe without psychotic features: Secondary | ICD-10-CM | POA: Diagnosis not present

## 2017-05-28 DIAGNOSIS — M79672 Pain in left foot: Secondary | ICD-10-CM | POA: Diagnosis not present

## 2017-05-28 DIAGNOSIS — R5081 Fever presenting with conditions classified elsewhere: Secondary | ICD-10-CM | POA: Diagnosis not present

## 2017-05-28 DIAGNOSIS — Z79899 Other long term (current) drug therapy: Secondary | ICD-10-CM | POA: Diagnosis not present

## 2017-05-28 DIAGNOSIS — M11272 Other chondrocalcinosis, left ankle and foot: Secondary | ICD-10-CM | POA: Diagnosis not present

## 2017-05-28 DIAGNOSIS — M7989 Other specified soft tissue disorders: Secondary | ICD-10-CM | POA: Diagnosis not present

## 2017-05-28 DIAGNOSIS — E119 Type 2 diabetes mellitus without complications: Secondary | ICD-10-CM | POA: Diagnosis not present

## 2017-05-28 DIAGNOSIS — Z6830 Body mass index (BMI) 30.0-30.9, adult: Secondary | ICD-10-CM | POA: Diagnosis not present

## 2017-05-28 DIAGNOSIS — Z9049 Acquired absence of other specified parts of digestive tract: Secondary | ICD-10-CM | POA: Diagnosis not present

## 2017-05-28 DIAGNOSIS — L03116 Cellulitis of left lower limb: Secondary | ICD-10-CM | POA: Diagnosis not present

## 2017-05-28 DIAGNOSIS — E6609 Other obesity due to excess calories: Secondary | ICD-10-CM | POA: Diagnosis not present

## 2017-05-28 DIAGNOSIS — I10 Essential (primary) hypertension: Secondary | ICD-10-CM | POA: Diagnosis not present

## 2017-05-29 DIAGNOSIS — F332 Major depressive disorder, recurrent severe without psychotic features: Secondary | ICD-10-CM | POA: Diagnosis not present

## 2017-05-29 DIAGNOSIS — E119 Type 2 diabetes mellitus without complications: Secondary | ICD-10-CM | POA: Diagnosis not present

## 2017-05-29 DIAGNOSIS — E6609 Other obesity due to excess calories: Secondary | ICD-10-CM | POA: Diagnosis not present

## 2017-05-29 DIAGNOSIS — I10 Essential (primary) hypertension: Secondary | ICD-10-CM | POA: Diagnosis not present

## 2017-05-29 DIAGNOSIS — M11272 Other chondrocalcinosis, left ankle and foot: Secondary | ICD-10-CM | POA: Diagnosis not present

## 2018-01-25 ENCOUNTER — Encounter: Payer: Self-pay | Admitting: Cardiology
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
## 2018-01-26 ENCOUNTER — Encounter: Payer: Self-pay | Admitting: Cardiology

## 2018-01-26 ENCOUNTER — Ambulatory Visit (INDEPENDENT_AMBULATORY_CARE_PROVIDER_SITE_OTHER): Payer: No Typology Code available for payment source | Admitting: Cardiology

## 2018-01-26 VITALS — BP 128/82 | HR 97 | Ht 69.0 in | Wt 214.2 lb

## 2018-01-26 DIAGNOSIS — R011 Cardiac murmur, unspecified: Secondary | ICD-10-CM

## 2018-01-26 DIAGNOSIS — R9431 Abnormal electrocardiogram [ECG] [EKG]: Secondary | ICD-10-CM | POA: Diagnosis not present

## 2018-01-26 DIAGNOSIS — Z9989 Dependence on other enabling machines and devices: Secondary | ICD-10-CM

## 2018-01-26 DIAGNOSIS — E1165 Type 2 diabetes mellitus with hyperglycemia: Secondary | ICD-10-CM

## 2018-01-26 DIAGNOSIS — G4733 Obstructive sleep apnea (adult) (pediatric): Secondary | ICD-10-CM

## 2018-01-26 DIAGNOSIS — I10 Essential (primary) hypertension: Secondary | ICD-10-CM | POA: Diagnosis not present

## 2018-01-26 NOTE — Patient Instructions (Addendum)
Medication Instructions:   Your physician recommends that you continue on your current medications as directed. Please refer to the Current Medication list given to you today.  Labwork:  NONE  Testing/Procedures: Your physician has requested that you have an echocardiogram. Echocardiography is a painless test that uses sound waves to create images of your heart. It provides your doctor with information about the size and shape of your heart and how well your heart's chambers and valves are working. This procedure takes approximately one hour. There are no restrictions for this procedure.  Follow-Up:  Your physician recommends that you schedule a follow-up appointment in: pending test result.  Any Other Special Instructions Will Be Listed Below (If Applicable).  If you need a refill on your cardiac medications before your next appointment, please call your pharmacy. 

## 2018-01-28 ENCOUNTER — Other Ambulatory Visit: Payer: Self-pay | Admitting: Cardiology

## 2018-01-28 DIAGNOSIS — R011 Cardiac murmur, unspecified: Secondary | ICD-10-CM

## 2018-02-10 ENCOUNTER — Encounter

## 2018-02-10 ENCOUNTER — Other Ambulatory Visit: Payer: Non-veteran care

## 2018-02-24 ENCOUNTER — Other Ambulatory Visit: Payer: Self-pay

## 2018-02-24 ENCOUNTER — Ambulatory Visit (INDEPENDENT_AMBULATORY_CARE_PROVIDER_SITE_OTHER): Payer: No Typology Code available for payment source

## 2018-02-24 ENCOUNTER — Encounter

## 2018-02-24 DIAGNOSIS — R011 Cardiac murmur, unspecified: Secondary | ICD-10-CM | POA: Diagnosis not present

## 2018-02-26 ENCOUNTER — Telehealth: Payer: Self-pay | Admitting: *Deleted

## 2018-02-26 NOTE — Telephone Encounter (Signed)
-----   Message from Satira Sark, MD sent at 02/25/2018  4:40 PM EST ----- Results reviewed.  Study shows mild to moderate LVH likely in association with hypertension, normal LVEF at 55 to 82% and mild diastolic dysfunction also in the setting of hypertension.  Importantly, there are no major valvular abnormalities, only mild mitral regurgitation.  He has a small pericardial effusion anteriorly which is asymptomatic.  At this point no further cardiac testing is planned.  He should keep follow-up with PCP.

## 2018-03-01 NOTE — Telephone Encounter (Signed)
Patient informed. Copy sent to PCP °

## 2019-12-06 ENCOUNTER — Encounter: Payer: Self-pay | Admitting: Internal Medicine

## 2020-08-01 DIAGNOSIS — R1110 Vomiting, unspecified: Secondary | ICD-10-CM | POA: Diagnosis not present

## 2020-08-01 DIAGNOSIS — M4186 Other forms of scoliosis, lumbar region: Secondary | ICD-10-CM | POA: Diagnosis not present

## 2020-09-07 ENCOUNTER — Other Ambulatory Visit: Payer: Self-pay | Admitting: Internal Medicine

## 2020-09-07 ENCOUNTER — Other Ambulatory Visit (HOSPITAL_COMMUNITY): Payer: Self-pay | Admitting: Internal Medicine

## 2021-04-11 ENCOUNTER — Telehealth: Payer: Self-pay | Admitting: Internal Medicine

## 2021-04-11 NOTE — Telephone Encounter (Signed)
Susie from the [place] ext [phone] wants to schedule this patient, he is overdue.does he need an office or a nurse visit? please call her and she will fax the appropriate approval papers

## 2021-04-11 NOTE — Telephone Encounter (Signed)
Lmom of Brandon Park at [place].

## 2021-04-12 NOTE — Telephone Encounter (Signed)
Spoke to [employer].  Pt not having any GI problems nor on any blood thinners.  Scheduled nurse visit by telephone for 04/25/2021 at 8:00.  Susie is calling pt to give him appointment.  She is faxing over [place] information.   FYI to [employer].

## 2021-04-25 ENCOUNTER — Ambulatory Visit (INDEPENDENT_AMBULATORY_CARE_PROVIDER_SITE_OTHER): Payer: Non-veteran care | Admitting: *Deleted

## 2021-04-25 VITALS — Ht 69.0 in | Wt 200.0 lb

## 2021-04-25 DIAGNOSIS — Z86010 Personal history of colonic polyps: Secondary | ICD-10-CM

## 2021-04-25 NOTE — Progress Notes (Signed)
Gastroenterology Pre-Procedure Review  Request Date: 04/25/2021 Requesting Physician: Dr. Burman Nieves Spracher @ [place], Last TCS 12/09/2016 by Dr. Oneida Alar, tubular adenoma (x4)  PATIENT REVIEW QUESTIONS: The patient responded to the following health history questions as indicated:    1. Diabetes Melitis: yes, type II  2. Joint replacements in the past 12 months: no 3. Major health problems in the past 3 months: no 4. Has an artificial valve or MVP: no 5. Has a defibrillator: no 6. Has been advised in past to take antibiotics in advance of a procedure like teeth cleaning: no 7. Family history of colon cancer: no 8. Alcohol Use: no 9. Illicit drug Use: no 10. History of sleep apnea: yes, CPAP 11. History of coronary artery or other vascular stents placed within the last 12 months: no 12. History of any prior anesthesia complications: no 13. Body mass index is 29.53 kg/m.    MEDICATIONS & ALLERGIES:    Patient reports the following regarding taking any blood thinners:   Plavix? no Aspirin? Yes, 325 mg daily Coumadin? no Brilinta? no Xarelto? no Eliquis? no Pradaxa? no Savaysa? no Effient? no  Patient confirms/reports the following medications:  Current Outpatient Medications  Medication Sig Dispense Refill   aspirin 325 MG tablet Take 325 mg by mouth daily.     Cholecalciferol (VITAMIN D-3) 5000 units TABS Take 5,000 Units by mouth daily.     empagliflozin (JARDIANCE) 25 MG TABS tablet Take by mouth daily.     glipiZIDE (GLUCOTROL) 10 MG tablet Take 10 mg by mouth 2 (two) times daily.     lisinopril-hydrochlorothiazide (PRINZIDE,ZESTORETIC) 20-25 MG tablet Take 1 tablet by mouth daily.     metFORMIN (GLUCOPHAGE) 1000 MG tablet Take 1,000 mg by mouth 2 (two) times daily with a meal.     Multiple Vitamin (MULTIVITAMIN WITH MINERALS) TABS tablet Take 1 tablet by mouth daily.     rosuvastatin (CRESTOR) 20 MG tablet Take 20 mg by mouth daily.     sertraline (ZOLOFT)  100 MG tablet Take 100 mg by mouth daily. Takes 1/2 tablet daily.     No current facility-administered medications for this visit.    Patient confirms/reports the following allergies:  No Known Allergies  No orders of the defined types were placed in this encounter.   AUTHORIZATION INFORMATION Primary Insurance: Human resources officer,  ID #: [id number] Pre-Cert / Josem Kaufmann required: Yes, received authorization from [facility] [id number] Pre-Cert / Auth #: [id number]  SCHEDULE INFORMATION: Procedure has been scheduled as follows:  Date: 05/27/2021, Time: 11:00  Location: [facility] with Dr. Abbey Chatters  This Gastroenterology Pre-Precedure Review Form is being routed to the following provider(s): Roseanne Kaufman, NP

## 2021-04-26 NOTE — Progress Notes (Signed)
ASA 3. No oral diabetes medication day of procedure.

## 2021-04-29 ENCOUNTER — Encounter: Payer: Self-pay | Admitting: *Deleted

## 2021-04-29 MED ORDER — PEG 3350-KCL-NA BICARB-NACL 420 G PO SOLR: 4000.0000 mL | Freq: Once | ORAL | 0 refills | Status: AC

## 2021-04-29 MED ORDER — FLEET ENEMA 7-19 GM/118ML RE ENEM: 1.0000 | ENEMA | Freq: Once | RECTAL | 0 refills | Status: AC

## 2021-04-29 MED ORDER — BISACODYL EC 5 MG PO TBEC: 5.0000 mg | DELAYED_RELEASE_TABLET | Freq: Once | ORAL | 0 refills | Status: AC
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
## 2021-04-29 NOTE — Progress Notes (Signed)
Lmom for pt to call me back. 

## 2021-04-29 NOTE — Progress Notes (Addendum)
Roseanne Kaufman, NP:  Would pt need to switch to 81 mg ASA?

## 2021-04-30 ENCOUNTER — Encounter: Payer: Self-pay | Admitting: *Deleted

## 2021-05-07 ENCOUNTER — Telehealth: Payer: Self-pay | Admitting: Internal Medicine

## 2021-05-07 NOTE — Telephone Encounter (Signed)
Noted. Thanks.

## 2021-05-07 NOTE — Telephone Encounter (Signed)
[facility] called to ask if we could change prep to Go[place]. Triage nurse said, yes. [place] is aware.

## 2021-05-08 NOTE — Progress Notes (Signed)
Called pt and he said he has already started taking 81 mg Aspirin instead of 325 mg.

## 2021-05-08 NOTE — Progress Notes (Signed)
Angie: if patient is able to take 81 mg aspirin daily instead, that would be great. Otherwise, he would need to hold aspirin 325 for 5 days.

## 2021-05-22 NOTE — Patient Instructions (Signed)
Brandon Park  05/22/2021     @PREFPERIOPPHARMACY @   Your procedure is scheduled on  05/27/2021.   Report to [facility] at  0900 A.M.   Call this number if you have problems the morning of surgery:  [phone]   Remember:  Follow the diet and prep instructions given to you by the office.      DO NOT take any medications for diabetes the morning of your procedure.   Take these medicines the morning of surgery with A SIP OF WATER                                            zoloft.     Do not wear jewelry, make-up or nail polish.  Do not wear lotions, powders, or perfumes, or deodorant.  Do not shave 48 hours prior to surgery.  Men may shave face and neck.  Do not bring valuables to the hospital.  [facility] is not responsible for any belongings or valuables.  Contacts, dentures or bridgework may not be worn into surgery.  Leave your suitcase in the car.  After surgery it may be brought to your room.  For patients admitted to the hospital, discharge time will be determined by your treatment team.  Patients discharged the day of surgery will not be allowed to drive home and must have someone with them for 24 hours.    Special instructions:   DO NOT smoke tobacco or vape for 24 hours before your procedure.  Please read over the following fact sheets that you were given. Anesthesia Post-op Instructions and Care and Recovery After Surgery      Colonoscopy, Adult, Care After This sheet gives you information about how to care for yourself after your procedure. Your health care provider may also give you more specific instructions. If you have problems or questions, contact your health care provider. What can I expect after the procedure? After the procedure, it is common to have: A small amount of blood in your stool for 24 hours after the procedure. Some gas. Mild cramping or bloating of your abdomen. Follow these instructions at home: Eating and  drinking  Drink enough fluid to keep your urine pale yellow. Follow instructions from your health care provider about eating or drinking restrictions. Resume your normal diet as instructed by your health care provider. Avoid heavy or fried foods that are hard to digest. Activity Rest as told by your health care provider. Avoid sitting for a long time without moving. Get up to take short walks every 1-2 hours. This is important to improve blood flow and breathing. Ask for help if you feel weak or unsteady. Return to your normal activities as told by your health care provider. Ask your health care provider what activities are safe for you. Managing cramping and bloating  Try walking around when you have cramps or feel bloated. Apply heat to your abdomen as told by your health care provider. Use the heat source that your health care provider recommends, such as a moist heat pack or a heating pad. Place a towel between your skin and the heat source. Leave the heat on for 20-30 minutes. Remove the heat if your skin turns bright red. This is especially important if you are unable to feel pain, heat, or cold. You may have a greater risk of  getting burned. General instructions If you were given a sedative during the procedure, it can affect you for several hours. Do not drive or operate machinery until your health care provider says that it is safe. For the first 24 hours after the procedure: Do not sign important documents. Do not drink alcohol. Do your regular daily activities at a slower pace than normal. Eat soft foods that are easy to digest. Take over-the-counter and prescription medicines only as told by your health care provider. Keep all follow-up visits as told by your health care provider. This is important. Contact a health care provider if: You have blood in your stool 2-3 days after the procedure. Get help right away if you have: More than a small spotting of blood in your  stool. Large blood clots in your stool. Swelling of your abdomen. Nausea or vomiting. A fever. Increasing pain in your abdomen that is not relieved with medicine. Summary After the procedure, it is common to have a small amount of blood in your stool. You may also have mild cramping and bloating of your abdomen. If you were given a sedative during the procedure, it can affect you for several hours. Do not drive or operate machinery until your health care provider says that it is safe. Get help right away if you have a lot of blood in your stool, nausea or vomiting, a fever, or increased pain in your abdomen. This information is not intended to replace advice given to you by your health care provider. Make sure you discuss any questions you have with your health care provider. Document Revised: 01/28/2019 Document Reviewed: 10/18/2018 Elsevier Patient Education  [place] After This sheet gives you information about how to care for yourself after your procedure. Your health care provider may also give you more specific instructions. If you have problems or questions, contact your health care provider. What can I expect after the procedure? After the procedure, it is common to have: Tiredness. Forgetfulness about what happened after the procedure. Impaired judgment for important decisions. Nausea or vomiting. Some difficulty with balance. Follow these instructions at home: For the time period you were told by your health care provider:   Rest as needed. Do not participate in activities where you could fall or become injured. Do not drive or use machinery. Do not drink alcohol. Do not take sleeping pills or medicines that cause drowsiness. Do not make important decisions or sign legal documents. Do not take care of children on your own. Eating and drinking Follow the diet that is recommended by your health care provider. Drink enough fluid to  keep your urine pale yellow. If you vomit: Drink water, juice, or soup when you can drink without vomiting. Make sure you have little or no nausea before eating solid foods. General instructions Have a responsible adult stay with you for the time you are told. It is important to have someone help care for you until you are awake and alert. Take over-the-counter and prescription medicines only as told by your health care provider. If you have sleep apnea, surgery and certain medicines can increase your risk for breathing problems. Follow instructions from your health care provider about wearing your sleep device: Anytime you are sleeping, including during daytime naps. While taking prescription pain medicines, sleeping medicines, or medicines that make you drowsy. Avoid smoking. Keep all follow-up visits as told by your health care provider. This is important. Contact a health care provider if:  You keep feeling nauseous or you keep vomiting. You feel light-headed. You are still sleepy or having trouble with balance after 24 hours. You develop a rash. You have a fever. You have redness or swelling around the IV site. Get help right away if: You have trouble breathing. You have new-onset confusion at home. Summary For several hours after your procedure, you may feel tired. You may also be forgetful and have poor judgment. Have a responsible adult stay with you for the time you are told. It is important to have someone help care for you until you are awake and alert. Rest as told. Do not drive or operate machinery. Do not drink alcohol or take sleeping pills. Get help right away if you have trouble breathing, or if you suddenly become confused. This information is not intended to replace advice given to you by your health care provider. Make sure you discuss any questions you have with your health care provider. Document Revised: 12/08/2019 Document Reviewed: 02/24/2019 Elsevier Patient  Education  2022 [employer].

## 2021-05-23 ENCOUNTER — Other Ambulatory Visit: Payer: Self-pay

## 2021-05-23 ENCOUNTER — Encounter (HOSPITAL_COMMUNITY): Payer: Self-pay

## 2021-05-23 ENCOUNTER — Encounter (HOSPITAL_COMMUNITY)
Admission: RE | Admit: 2021-05-23 | Discharge: 2021-05-23 | Disposition: A | Discharge status: Home / Self Care | Payer: No Typology Code available for payment source | Source: Ambulatory Visit | Attending: Internal Medicine | Admitting: Internal Medicine

## 2021-05-23 VITALS — BP 127/79 | HR 89 | Temp 97.1°F | Resp 18 | Ht 69.0 in | Wt 205.0 lb

## 2021-05-23 DIAGNOSIS — Z01818 Encounter for other preprocedural examination: Secondary | ICD-10-CM | POA: Diagnosis not present

## 2021-05-23 DIAGNOSIS — E119 Type 2 diabetes mellitus without complications: Secondary | ICD-10-CM | POA: Diagnosis not present

## 2021-05-23 LAB — BASIC METABOLIC PANEL
Anion gap: 16 — ABNORMAL HIGH (ref 5–15)
BUN: 24 mg/dL — ABNORMAL HIGH (ref 8–23)
CO2: 23 mmol/L (ref 22–32)
Calcium: 10.2 mg/dL (ref 8.9–10.3)
Chloride: 100 mmol/L (ref 98–111)
Creatinine, Ser: 1.18 mg/dL (ref 0.61–1.24)
GFR, Estimated: >60 mL/min (ref >60)
Glucose, Bld: 284 mg/dL — ABNORMAL HIGH (ref 70–99)
Potassium: 3.5 mmol/L (ref 3.5–5.1)
Sodium: 139 mmol/L (ref 135–145)

## 2021-05-27 ENCOUNTER — Other Ambulatory Visit: Payer: Self-pay

## 2021-05-27 ENCOUNTER — Ambulatory Visit (HOSPITAL_COMMUNITY)
Admission: RE | Admit: 2021-05-27 | Discharge: 2021-05-27 | Disposition: A | Discharge status: Home / Self Care | Payer: No Typology Code available for payment source | Attending: Internal Medicine | Admitting: Internal Medicine

## 2021-05-27 ENCOUNTER — Encounter (HOSPITAL_COMMUNITY)
Admission: RE | Disposition: A | Discharge status: Home / Self Care | Payer: Self-pay | Source: Home / Self Care | Attending: Internal Medicine

## 2021-05-27 ENCOUNTER — Encounter (HOSPITAL_COMMUNITY): Payer: Self-pay

## 2021-05-27 ENCOUNTER — Ambulatory Visit (HOSPITAL_COMMUNITY): Payer: No Typology Code available for payment source | Admitting: Anesthesiology

## 2021-05-27 ENCOUNTER — Ambulatory Visit (HOSPITAL_BASED_OUTPATIENT_CLINIC_OR_DEPARTMENT_OTHER): Payer: No Typology Code available for payment source | Admitting: Anesthesiology

## 2021-05-27 DIAGNOSIS — Z87891 Personal history of nicotine dependence: Secondary | ICD-10-CM | POA: Insufficient documentation

## 2021-05-27 DIAGNOSIS — Z86010 Personal history of colonic polyps: Secondary | ICD-10-CM | POA: Insufficient documentation

## 2021-05-27 DIAGNOSIS — D175 Benign lipomatous neoplasm of intra-abdominal organs: Secondary | ICD-10-CM | POA: Insufficient documentation

## 2021-05-27 DIAGNOSIS — M1990 Unspecified osteoarthritis, unspecified site: Secondary | ICD-10-CM | POA: Insufficient documentation

## 2021-05-27 DIAGNOSIS — Z7984 Long term (current) use of oral hypoglycemic drugs: Secondary | ICD-10-CM | POA: Insufficient documentation

## 2021-05-27 DIAGNOSIS — K648 Other hemorrhoids: Secondary | ICD-10-CM | POA: Insufficient documentation

## 2021-05-27 DIAGNOSIS — Z1211 Encounter for screening for malignant neoplasm of colon: Secondary | ICD-10-CM | POA: Insufficient documentation

## 2021-05-27 DIAGNOSIS — K5730 Diverticulosis of large intestine without perforation or abscess without bleeding: Secondary | ICD-10-CM | POA: Insufficient documentation

## 2021-05-27 DIAGNOSIS — E119 Type 2 diabetes mellitus without complications: Secondary | ICD-10-CM | POA: Insufficient documentation

## 2021-05-27 DIAGNOSIS — F32A Depression, unspecified: Secondary | ICD-10-CM | POA: Diagnosis not present

## 2021-05-27 DIAGNOSIS — G4733 Obstructive sleep apnea (adult) (pediatric): Secondary | ICD-10-CM | POA: Diagnosis not present

## 2021-05-27 DIAGNOSIS — I10 Essential (primary) hypertension: Secondary | ICD-10-CM | POA: Diagnosis not present

## 2021-05-27 HISTORY — PX: COLONOSCOPY WITH PROPOFOL: SHX5780

## 2021-05-27 LAB — GLUCOSE, CAPILLARY: Glucose-Capillary: 136 mg/dL — ABNORMAL HIGH (ref 70–99)

## 2021-05-27 SURGERY — COLONOSCOPY WITH PROPOFOL
Anesthesia: General

## 2021-05-27 MED ORDER — LIDOCAINE HCL (CARDIAC) PF 100 MG/5ML IV SOSY
PREFILLED_SYRINGE | INTRAVENOUS | Status: DC | PRN
  Administered 2021-05-27: 50 mg via INTRAVENOUS

## 2021-05-27 MED ORDER — PROPOFOL 500 MG/50ML IV EMUL
INTRAVENOUS | Status: DC | PRN
Start: 2021-05-27 — End: 2021-05-27
  Administered 2021-05-27: 125 ug/kg/min via INTRAVENOUS

## 2021-05-27 MED ORDER — LACTATED RINGERS IV SOLN: INTRAVENOUS | Status: DC

## 2021-05-27 MED ORDER — SIMETHICONE 40 MG/0.6ML PO SUSP
ORAL | Status: DC | PRN
  Administered 2021-05-27: 120 mL

## 2021-05-27 MED ORDER — PROPOFOL 10 MG/ML IV BOLUS
INTRAVENOUS | Status: DC | PRN
Start: 2021-05-27 — End: 2021-05-27
  Administered 2021-05-27: 100 mg via INTRAVENOUS

## 2021-05-27 NOTE — Anesthesia Postprocedure Evaluation (Signed)
Anesthesia Post Note  Patient: Brandon Park  Procedure(s) Performed: COLONOSCOPY WITH PROPOFOL  Patient location during evaluation: Phase II Anesthesia Type: General Level of consciousness: awake and alert and oriented Pain management: pain level controlled Vital Signs Assessment: post-procedure vital signs reviewed and stable Respiratory status: spontaneous breathing, nonlabored ventilation and respiratory function stable Cardiovascular status: blood pressure returned to baseline and stable Postop Assessment: no apparent nausea or vomiting Anesthetic complications: no   No notable events documented.   Last Vitals:  Vitals:   05/27/21 1017 05/27/21 1108  BP: 134/70 (!) 97/59  Pulse: 70 90  Resp: 18 18  Temp: 36.8 C 36.5 C  SpO2: 97% 97%    Last Pain:  Vitals:   05/27/21 1108  TempSrc: Oral  PainSc: 0-No pain                 Tauri Ethington C Muhammed Teutsch

## 2021-05-27 NOTE — Op Note (Signed)
[facility] Patient Name: Brandon Park Procedure Date: 05/27/2021 10:43 AM MRN: [id number] Date of Birth: [birth year] Attending MD: Elon Alas. Edgar Frisk CSN: [id number] Age: 71 Admit Type: Outpatient Procedure:                Colonoscopy Indications:              High risk colon cancer surveillance: Personal                            history of colonic polyps Providers:                Elon Alas. Abbey Chatters, DO, Hughie Closs RN, RN, Rosina Lowenstein, RN, Caprice Kluver Referring MD:              Medicines:                See the Anesthesia note for documentation of the                            administered medications Complications:            No immediate complications. Estimated Blood Loss:     Estimated blood loss: none. Procedure:                Pre-Anesthesia Assessment:                           - The anesthesia plan was to use monitored                            anesthesia care (MAC).                           After obtaining informed consent, the colonoscope                            was passed under direct vision. Throughout the                            procedure, the patient's blood pressure, pulse, and                            oxygen saturations were monitored continuously. The                            PCF-HQ190L ([id number]) scope was introduced through                            the anus and advanced to the the cecum, identified                            by appendiceal orifice and ileocecal valve. The                            colonoscopy was performed without difficulty.  The                            patient tolerated the procedure well. The quality                            of the bowel preparation was evaluated using the                            BBPS [facility] Bowel Preparation Scale) with scores                            of: Right Colon = 2 (minor amount of residual                            staining, small fragments of stool and/or  opaque                            liquid, but mucosa seen well), Transverse Colon = 3                            (entire mucosa seen well with no residual staining,                            small fragments of stool or opaque liquid) and Left                            Colon = 3 (entire mucosa seen well with no residual                            staining, small fragments of stool or opaque                            liquid). The total BBPS score equals 8. The quality                            of the bowel preparation was good. Scope In: 10:51:21 AM Scope Out: 11:05:09 AM Scope Withdrawal Time: 0 hours 10 minutes 33 seconds  Total Procedure Duration: 0 hours 13 minutes 48 seconds  Findings:      The perianal and digital rectal examinations were normal.      Non-bleeding internal hemorrhoids were found during endoscopy.      Multiple medium-mouthed diverticula were found in the sigmoid colon.      There was a medium-sized lipoma, at the hepatic flexure.      The exam was otherwise without abnormality. Impression:               - Non-bleeding internal hemorrhoids.                           - Diverticulosis in the sigmoid colon.                           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                           -  The examination was otherwise normal.                           - No specimens collected. Moderate Sedation:      Per Anesthesia Care Recommendation:           - Patient has a contact number available for                            emergencies. The signs and symptoms of potential                            delayed complications were discussed with the                            patient. Return to normal activities tomorrow.                            Written discharge instructions were provided to the                            patient.                           - Resume previous diet.                           - Continue present medications.                            - Repeat colonoscopy in 5 years for surveillance.                           - Return to GI clinic PRN. Procedure Code(s):        --- Professional ---                           H6314, Colorectal cancer screening; colonoscopy on                            individual at high risk Diagnosis Code(s):        --- Professional ---                           Z86.010, Personal history of colonic polyps                           K64.8, Other hemorrhoids                           K57.30, Diverticulosis of large intestine without                            perforation or abscess without bleeding CPT copyright 2019 American Medical Association. All rights reserved. The codes documented in this report are preliminary and upon coder review may  be revised to meet current compliance requirements. Elon Alas. Abbey Chatters, DO Juanda Crumble  Arlee Muslim, DO 05/27/2021 11:07:58 AM This report has been signed electronically. Number of Addenda: 0

## 2021-05-27 NOTE — Discharge Instructions (Addendum)
°  Colonoscopy Discharge Instructions  Read the instructions outlined below and refer to this sheet in the next few weeks. These discharge instructions provide you with general information on caring for yourself after you leave the hospital. Your doctor may also give you specific instructions. While your treatment has been planned according to the most current medical practices available, unavoidable complications occasionally occur.   ACTIVITY You may resume your regular activity, but move at a slower pace for the next 24 hours.  Take frequent rest periods for the next 24 hours.  Walking will help get rid of the air and reduce the bloated feeling in your belly (abdomen).  No driving for 24 hours (because of the medicine (anesthesia) used during the test).   Do not sign any important legal documents or operate any machinery for 24 hours (because of the anesthesia used during the test).  NUTRITION Drink plenty of fluids.  You may resume your normal diet as instructed by your doctor.  Begin with a light meal and progress to your normal diet. Heavy or fried foods are harder to digest and may make you feel sick to your stomach (nauseated).  Avoid alcoholic beverages for 24 hours or as instructed.  MEDICATIONS You may resume your normal medications unless your doctor tells you otherwise.  WHAT YOU CAN EXPECT TODAY Some feelings of bloating in the abdomen.  Passage of more gas than usual.  Spotting of blood in your stool or on the toilet paper.  IF YOU HAD POLYPS REMOVED DURING THE COLONOSCOPY: No aspirin products for 7 days or as instructed.  No alcohol for 7 days or as instructed.  Eat a soft diet for the next 24 hours.  FINDING OUT THE RESULTS OF YOUR TEST Not all test results are available during your visit. If your test results are not back during the visit, make an appointment with your caregiver to find out the results. Do not assume everything is normal if you have not heard from your  caregiver or the medical facility. It is important for you to follow up on all of your test results.  SEEK IMMEDIATE MEDICAL ATTENTION IF: You have more than a spotting of blood in your stool.  Your belly is swollen (abdominal distention).  You are nauseated or vomiting.  You have a temperature over 101.  You have abdominal pain or discomfort that is severe or gets worse throughout the day.   Your colonoscopy was relatively unremarkable.  I did not find any polyps or evidence of colon cancer.  I recommend repeating colonoscopy in 5 years given your history of polyps.  You do have diverticulosis and internal hemorrhoids. I would recommend increasing fiber in your diet or adding OTC Benefiber/Metamucil. Be sure to drink at least 4 to 6 glasses of water daily. Follow-up with GI as needed.   I hope you have a great rest of your week!  Elon Alas. Abbey Chatters, D.O. Gastroenterology and Hepatology [facility]

## 2021-05-27 NOTE — Transfer of Care (Signed)
Immediate Anesthesia Transfer of Care Note  Patient: Brandon Park  Procedure(s) Performed: COLONOSCOPY WITH PROPOFOL  Patient Location: Short Stay  Anesthesia Type:General  Level of Consciousness: awake and alert   Airway & Oxygen Therapy: Patient Spontanous Breathing  Post-op Assessment: Report given to RN and Post -op Vital signs reviewed and stable  Post vital signs: Reviewed and stable  Last Vitals:  Vitals Value Taken Time  BP 97/59 05/27/21 1108  Temp 36.5 C 05/27/21 1108  Pulse 90 05/27/21 1108  Resp 18 05/27/21 1108  SpO2 97 % 05/27/21 1108    Last Pain:  Vitals:   05/27/21 1108  TempSrc: Oral  PainSc: 0-No pain         Complications: No notable events documented.
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
## 2021-05-27 NOTE — Anesthesia Preprocedure Evaluation (Addendum)
Anesthesia Evaluation  Patient identified by MRN, date of birth, ID band Patient awake    Reviewed: Allergy & Precautions, NPO status , Patient's Chart, lab work & pertinent test results  Airway Mallampati: I  TM Distance: >3 FB Neck ROM: Full    Dental  (+) Dental Advisory Given, Teeth Intact   Pulmonary sleep apnea and Continuous Positive Airway Pressure Ventilation , former smoker,    Pulmonary exam normal breath sounds clear to auscultation       Cardiovascular Exercise Tolerance: Good hypertension, Pt. on medications Normal cardiovascular exam Rhythm:Regular Rate:Normal     Neuro/Psych PSYCHIATRIC DISORDERS Depression negative neurological ROS     GI/Hepatic negative GI ROS, Neg liver ROS,   Endo/Other  diabetes, Well Controlled, Type 2, Oral Hypoglycemic Agents  Renal/GU negative Renal ROS  negative genitourinary   Musculoskeletal  (+) Arthritis , Osteoarthritis,    Abdominal   Peds negative pediatric ROS (+)  Hematology negative hematology ROS (+)   Anesthesia Other Findings Occasional PVCs  Reproductive/Obstetrics negative OB ROS                           Anesthesia Physical Anesthesia Plan  ASA: 2  Anesthesia Plan: General   Post-op Pain Management: Minimal or no pain anticipated   Induction: Intravenous  PONV Risk Score and Plan: TIVA  Airway Management Planned: Nasal Cannula and Natural Airway  Additional Equipment:   Intra-op Plan:   Post-operative Plan:   Informed Consent: I have reviewed the patients History and Physical, chart, labs and discussed the procedure including the risks, benefits and alternatives for the proposed anesthesia with the patient or authorized representative who has indicated his/her understanding and acceptance.     Dental advisory given  Plan Discussed with: CRNA and Surgeon  Anesthesia Plan Comments:        Anesthesia Quick  Evaluation

## 2021-05-29 ENCOUNTER — Encounter (HOSPITAL_COMMUNITY): Payer: Self-pay | Admitting: Internal Medicine

## 2021-05-31 DIAGNOSIS — M11271 Other chondrocalcinosis, right ankle and foot: Secondary | ICD-10-CM | POA: Diagnosis not present

## 2021-06-28 DIAGNOSIS — U071 COVID-19: Secondary | ICD-10-CM | POA: Diagnosis not present

## 2021-06-28 DIAGNOSIS — M791 Myalgia, unspecified site: Secondary | ICD-10-CM | POA: Diagnosis not present

## 2021-06-28 DIAGNOSIS — Z20822 Contact with and (suspected) exposure to covid-19: Secondary | ICD-10-CM | POA: Diagnosis not present

## 2022-01-28 DIAGNOSIS — I10 Essential (primary) hypertension: Secondary | ICD-10-CM | POA: Diagnosis not present

## 2022-01-28 DIAGNOSIS — Z299 Encounter for prophylactic measures, unspecified: Secondary | ICD-10-CM | POA: Diagnosis not present

## 2022-01-28 DIAGNOSIS — R569 Unspecified convulsions: Secondary | ICD-10-CM | POA: Diagnosis not present

## 2022-01-28 DIAGNOSIS — Z6828 Body mass index (BMI) 28.0-28.9, adult: Secondary | ICD-10-CM | POA: Diagnosis not present

## 2022-01-28 DIAGNOSIS — E119 Type 2 diabetes mellitus without complications: Secondary | ICD-10-CM | POA: Diagnosis not present

## 2022-01-28 DIAGNOSIS — F339 Major depressive disorder, recurrent, unspecified: Secondary | ICD-10-CM | POA: Diagnosis not present

## 2022-03-03 DIAGNOSIS — K0510 Chronic gingivitis, plaque induced: Secondary | ICD-10-CM | POA: Diagnosis not present

## 2022-03-03 DIAGNOSIS — I10 Essential (primary) hypertension: Secondary | ICD-10-CM | POA: Diagnosis not present

## 2022-03-03 DIAGNOSIS — S00522A Blister (nonthermal) of oral cavity, initial encounter: Secondary | ICD-10-CM | POA: Diagnosis not present

## 2022-03-03 DIAGNOSIS — Z299 Encounter for prophylactic measures, unspecified: Secondary | ICD-10-CM | POA: Diagnosis not present

## 2022-03-03 DIAGNOSIS — E119 Type 2 diabetes mellitus without complications: Secondary | ICD-10-CM | POA: Diagnosis not present

## 2022-03-03 DIAGNOSIS — Z6827 Body mass index (BMI) 27.0-27.9, adult: Secondary | ICD-10-CM | POA: Diagnosis not present

## 2022-06-11 DIAGNOSIS — M11272 Other chondrocalcinosis, left ankle and foot: Secondary | ICD-10-CM | POA: Diagnosis not present

## 2022-06-11 DIAGNOSIS — E119 Type 2 diabetes mellitus without complications: Secondary | ICD-10-CM | POA: Diagnosis not present

## 2022-06-11 DIAGNOSIS — E1165 Type 2 diabetes mellitus with hyperglycemia: Secondary | ICD-10-CM | POA: Diagnosis not present

## 2022-06-11 DIAGNOSIS — Z299 Encounter for prophylactic measures, unspecified: Secondary | ICD-10-CM | POA: Diagnosis not present

## 2022-06-11 DIAGNOSIS — I10 Essential (primary) hypertension: Secondary | ICD-10-CM | POA: Diagnosis not present

## 2022-09-23 DIAGNOSIS — I10 Essential (primary) hypertension: Secondary | ICD-10-CM | POA: Diagnosis not present

## 2022-09-23 DIAGNOSIS — Z299 Encounter for prophylactic measures, unspecified: Secondary | ICD-10-CM | POA: Diagnosis not present

## 2022-09-23 DIAGNOSIS — E1165 Type 2 diabetes mellitus with hyperglycemia: Secondary | ICD-10-CM | POA: Diagnosis not present

## 2023-02-25 DIAGNOSIS — E7800 Pure hypercholesterolemia, unspecified: Secondary | ICD-10-CM | POA: Diagnosis not present

## 2023-02-25 DIAGNOSIS — Z79899 Other long term (current) drug therapy: Secondary | ICD-10-CM | POA: Diagnosis not present

## 2023-02-25 DIAGNOSIS — Z7189 Other specified counseling: Secondary | ICD-10-CM | POA: Diagnosis not present

## 2023-02-25 DIAGNOSIS — Z299 Encounter for prophylactic measures, unspecified: Secondary | ICD-10-CM | POA: Diagnosis not present

## 2023-02-25 DIAGNOSIS — I10 Essential (primary) hypertension: Secondary | ICD-10-CM | POA: Diagnosis not present

## 2023-02-25 DIAGNOSIS — Z0000 Encounter for general adult medical examination without abnormal findings: Secondary | ICD-10-CM | POA: Diagnosis not present

## 2023-02-25 DIAGNOSIS — E1165 Type 2 diabetes mellitus with hyperglycemia: Secondary | ICD-10-CM | POA: Diagnosis not present

## 2023-03-03 DIAGNOSIS — I10 Essential (primary) hypertension: Secondary | ICD-10-CM | POA: Diagnosis not present

## 2023-03-03 DIAGNOSIS — Z299 Encounter for prophylactic measures, unspecified: Secondary | ICD-10-CM | POA: Diagnosis not present

## 2023-03-03 DIAGNOSIS — E1165 Type 2 diabetes mellitus with hyperglycemia: Secondary | ICD-10-CM | POA: Diagnosis not present

## 2023-06-15 DIAGNOSIS — E1165 Type 2 diabetes mellitus with hyperglycemia: Secondary | ICD-10-CM | POA: Diagnosis not present

## 2023-06-15 DIAGNOSIS — Z299 Encounter for prophylactic measures, unspecified: Secondary | ICD-10-CM | POA: Diagnosis not present

## 2023-06-15 DIAGNOSIS — I10 Essential (primary) hypertension: Secondary | ICD-10-CM | POA: Diagnosis not present

## 2023-10-26 DIAGNOSIS — M170 Bilateral primary osteoarthritis of knee: Secondary | ICD-10-CM | POA: Diagnosis not present

## 2023-10-26 DIAGNOSIS — M1711 Unilateral primary osteoarthritis, right knee: Secondary | ICD-10-CM | POA: Diagnosis not present

## 2023-11-16 DIAGNOSIS — E1165 Type 2 diabetes mellitus with hyperglycemia: Secondary | ICD-10-CM | POA: Diagnosis not present

## 2023-11-16 DIAGNOSIS — Z299 Encounter for prophylactic measures, unspecified: Secondary | ICD-10-CM | POA: Diagnosis not present

## 2023-11-16 DIAGNOSIS — I10 Essential (primary) hypertension: Secondary | ICD-10-CM | POA: Diagnosis not present

## 2023-11-16 DIAGNOSIS — E119 Type 2 diabetes mellitus without complications: Secondary | ICD-10-CM | POA: Diagnosis not present

## 2023-11-30 NOTE — Progress Notes (Signed)
 Sent message, via epic in basket, requesting orders in epic from Careers adviser.

## 2023-12-03 NOTE — Progress Notes (Signed)
 Second request for pre op orders sent to S. Wills.

## 2023-12-06 NOTE — Patient Instructions (Signed)
 SURGICAL WAITING ROOM VISITATION Patients having surgery or a procedure may have no more than 2 support people in the waiting area - these visitors may rotate in the visitor waiting room.   If the patient needs to stay at the hospital during part of their recovery, the visitor guidelines for inpatient rooms apply.  PRE-OP VISITATION  Pre-op nurse will coordinate an appropriate time for 1 support person to accompany the patient in pre-op.  This support person may not rotate.  This visitor will be contacted when the time is appropriate for the visitor to come back in the pre-op area.  Please refer to the [facility] website for the visitor guidelines for Inpatients (after your surgery is over and you are in a regular room).  You are not required to quarantine at this time prior to your surgery. However, you must do this: Hand Hygiene often Do NOT share personal items Notify your provider if you are in close contact with someone who has COVID or you develop fever 100.4 or greater, new onset of sneezing, cough, sore throat, shortness of breath or body aches.  If you test positive for Covid or have been in contact with anyone that has tested positive in the last 10 days please notify you surgeon.    Your procedure is scheduled on:  [facility]  December 16, 2023  Report to [facility] Main Entrance: [place] entrance where the [employer] is available.   Report to admitting at:  06:00 AM  Call this number if you have any questions or problems the morning of surgery [phone]  DO NOT EAT OR DRINK ANYTHING AFTER MIDNIGHT THE NIGHT PRIOR TO YOUR SURGERY / PROCEDURE.   FOLLOW  ANY ADDITIONAL PRE OP INSTRUCTIONS YOU RECEIVED FROM YOUR SURGEON'S OFFICE!!!   Oral Hygiene is also important to reduce your risk of infection.        Remember - BRUSH YOUR TEETH THE MORNING OF SURGERY WITH YOUR REGULAR TOOTHPASTE  Do NOT smoke after Midnight the night before surgery.  STOP TAKING all  Vitamins, Herbs and supplements 1 week before your surgery.  Stop taking ASPIRIN one week before surgery.   METFORMIN-  Do not take the morning of surgery.  GLIPIZIDE- Do not take the morning of surgery.  JARDIANCE- Stop taking 72 hours before your surgery.  Last dose will be taken on Friday December 12, 2023  Take ONLY these medicines the morning of surgery with A SIP OF WATER : Fluoxetine, topiramate.   If You have been diagnosed with Sleep Apnea - Bring CPAP mask and tubing day of surgery. We will provide you with a CPAP machine on the day of your surgery.                   You may not have any metal on your body including  jewelry, and body piercing  Do not wear  lotions, powders, cologne, or deodorant  Men may shave face and neck.  Contacts, Hearing Aids, dentures or bridgework may not be worn into surgery. DENTURES WILL BE REMOVED PRIOR TO SURGERY PLEASE DO NOT APPLY Poly grip OR ADHESIVES!!! You may bring a small overnight bag with you on the day of surgery, only pack items that are not valuable. [facility] IS NOT RESPONSIBLE   FOR VALUABLES THAT ARE LOST OR STOLEN.   Do not bring your home medications to the hospital. The Pharmacy will dispense medications listed on your medication list to you during your admission in the  Hospital.  Please read over the following fact sheets you were given: IF YOU HAVE QUESTIONS ABOUT YOUR PRE-OP INSTRUCTIONS, PLEASE CALL [phone].     Pre-operative 5 CHG Bath Instructions   You can play a key role in reducing the risk of infection after surgery. Your skin needs to be as free of germs as possible. You can reduce the number of germs on your skin by washing with CHG (chlorhexidine  gluconate) soap before surgery. CHG is an antiseptic soap that kills germs and continues to kill germs even after washing.   DO NOT use if you have an allergy to chlorhexidine /CHG or antibacterial soaps. If your skin becomes reddened or irritated, stop using the  CHG and notify one of our RNs at [phone]  Please shower with the CHG soap starting 4 days before surgery using the following schedule: START SHOWERS ON            SATURDAY December 12, 2023                                                                                                                                                                            Please keep in mind the following:  DO NOT shave, including legs and underarms, starting the day of your first shower.   You may shave your face at any point before/day of surgery.   Place clean sheets on your bed the day you start using CHG soap. Use a clean washcloth (not used since being washed) for each shower. DO NOT sleep with pets once you start using the CHG.   CHG Shower Instructions:  If you choose to wash your hair and private area, wash first with your normal shampoo/soap.  After you use shampoo/soap, rinse your hair and body thoroughly to remove shampoo/soap residue.  Turn the water  OFF and apply about 3 tablespoons (45 ml) of CHG soap to a CLEAN washcloth.  Apply CHG soap ONLY FROM YOUR NECK DOWN TO YOUR TOES (washing for 3-5 minutes)  DO NOT use CHG soap on face, private areas, open wounds, or sores.  Pay special attention to the area where your surgery is being performed.  If you are having back surgery, having someone wash your back for you may be helpful.  Wait 2 minutes after CHG soap is applied, then you may rinse off the CHG soap.  Pat dry with a clean towel  Put on clean clothes/pajamas   If you choose to wear lotion, please use ONLY the CHG-compatible lotions on the back of this paper.     Additional instructions for the day of surgery: DO NOT APPLY any lotions, deodorants, cologne, or perfumes.   Put on clean/comfortable clothes.  Brush your  teeth.  Ask your nurse before applying any prescription medications to the skin.      CHG Compatible Lotions   Aveeno Moisturizing lotion  Cetaphil  Moisturizing Cream  Cetaphil Moisturizing Lotion  Clairol Herbal Essence Moisturizing Lotion, Dry Skin  Clairol Herbal Essence Moisturizing Lotion, Extra Dry Skin  Clairol Herbal Essence Moisturizing Lotion, Normal Skin  Curel Age Defying Therapeutic Moisturizing Lotion with Alpha Hydroxy  Curel Extreme Care Body Lotion  Curel Soothing Hands Moisturizing Hand Lotion  Curel Therapeutic Moisturizing Cream, Fragrance-Free  Curel Therapeutic Moisturizing Lotion, Fragrance-Free  Curel Therapeutic Moisturizing Lotion, Original Formula  Eucerin Daily Replenishing Lotion  Eucerin Dry Skin Therapy Plus Alpha Hydroxy Crme  Eucerin Dry Skin Therapy Plus Alpha Hydroxy Lotion  Eucerin Original Crme  Eucerin Original Lotion  Eucerin Plus Crme Eucerin Plus Lotion  Eucerin TriLipid Replenishing Lotion  Keri Anti-Bacterial Hand Lotion  Keri Deep Conditioning Original Lotion Dry Skin Formula Softly Scented  Keri Deep Conditioning Original Lotion, Fragrance Free Sensitive Skin Formula  Keri Lotion Fast Absorbing Fragrance Free Sensitive Skin Formula  Keri Lotion Fast Absorbing Softly Scented Dry Skin Formula  Keri Original Lotion  Keri Skin Renewal Lotion Keri Silky Smooth Lotion  Keri Silky Smooth Sensitive Skin Lotion  Nivea Body Creamy Conditioning Oil  Nivea Body Extra Enriched Lotion  Nivea Body Original Lotion  Nivea Body Sheer Moisturizing Lotion Nivea Crme  Nivea Skin Firming Lotion  NutraDerm 30 Skin Lotion  NutraDerm Skin Lotion  NutraDerm Therapeutic Skin Cream  NutraDerm Therapeutic Skin Lotion  ProShield Protective Hand Cream  Provon moisturizing lotion  FAILURE TO FOLLOW THESE INSTRUCTIONS MAY RESULT IN THE CANCELLATION OF YOUR SURGERY  PATIENT SIGNATURE_________________________________  NURSE SIGNATURE__________________________________  ________________________________________________________________________         Brandon Park    An incentive  spirometer is a tool that can help keep your lungs clear and active. This tool measures how well you are filling your lungs with each breath. Taking long deep breaths may help reverse or decrease the chance of developing breathing (pulmonary) problems (especially infection) following: A long period of time when you are unable to move or be active. BEFORE THE PROCEDURE  If the spirometer includes an indicator to show your best effort, your nurse or respiratory therapist will set it to a desired goal. If possible, sit up straight or lean slightly forward. Try not to slouch. Hold the incentive spirometer in an upright position. INSTRUCTIONS FOR USE  Sit on the edge of your bed if possible, or sit up as far as you can in bed or on a chair. Hold the incentive spirometer in an upright position. Breathe out normally. Place the mouthpiece in your mouth and seal your lips tightly around it. Breathe in slowly and as deeply as possible, raising the piston or the ball toward the top of the column. Hold your breath for 3-5 seconds or for as long as possible. Allow the piston or ball to fall to the bottom of the column. Remove the mouthpiece from your mouth and breathe out normally. Rest for a few seconds and repeat Steps 1 through 7 at least 10 times every 1-2 hours when you are awake. Take your time and take a few normal breaths between deep breaths. The spirometer may include an indicator to show your best effort. Use the indicator as a goal to work toward during each repetition. After each set of 10 deep breaths, practice coughing to be sure your lungs are clear. If you have an incision (the  cut made at the time of surgery), support your incision when coughing by placing a pillow or rolled up towels firmly against it. Once you are able to get out of bed, walk around indoors and cough well. You may stop using the incentive spirometer when instructed by your caregiver.  RISKS AND COMPLICATIONS Take your time  so you do not get dizzy or light-headed. If you are in pain, you may need to take or ask for pain medication before doing incentive spirometry. It is harder to take a deep breath if you are having pain. AFTER USE Rest and breathe slowly and easily. It can be helpful to keep track of a log of your progress. Your caregiver can provide you with a simple table to help with this. If you are using the spirometer at home, follow these instructions: SEEK MEDICAL CARE IF:  You are having difficultly using the spirometer. You have trouble using the spirometer as often as instructed. Your pain medication is not giving enough relief while using the spirometer. You develop fever of 100.5 F (38.1 C) or higher.                                                                                                    SEEK IMMEDIATE MEDICAL CARE IF:  You cough up bloody sputum that had not been present before. You develop fever of 102 F (38.9 C) or greater. You develop worsening pain at or near the incision site. MAKE SURE YOU:  Understand these instructions. Will watch your condition. Will get help right away if you are not doing well or get worse. Document Released: 08/04/2006 Document Revised: 06/16/2011 Document Reviewed: 10/05/2006 [facility] Patient Information 2014 [place], [place].      If you would like to see a video about joint replacement:   [email or web address]
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
## 2023-12-08 ENCOUNTER — Ambulatory Visit: Payer: Self-pay | Admitting: Student

## 2023-12-08 ENCOUNTER — Encounter (HOSPITAL_COMMUNITY): Payer: Self-pay

## 2023-12-08 ENCOUNTER — Encounter (HOSPITAL_COMMUNITY)
Admission: RE | Admit: 2023-12-08 | Discharge: 2023-12-08 | Disposition: A | Discharge status: Home / Self Care | Source: Ambulatory Visit | Attending: Orthopedic Surgery | Admitting: Orthopedic Surgery

## 2023-12-08 VITALS — BP 138/78 | HR 100 | Temp 99.5°F | Resp 16 | Ht 69.0 in | Wt 206.0 lb

## 2023-12-08 DIAGNOSIS — E119 Type 2 diabetes mellitus without complications: Secondary | ICD-10-CM | POA: Diagnosis not present

## 2023-12-08 DIAGNOSIS — I10 Essential (primary) hypertension: Secondary | ICD-10-CM | POA: Diagnosis not present

## 2023-12-08 DIAGNOSIS — Z01818 Encounter for other preprocedural examination: Secondary | ICD-10-CM | POA: Diagnosis present

## 2023-12-08 DIAGNOSIS — Z01812 Encounter for preprocedural laboratory examination: Secondary | ICD-10-CM | POA: Diagnosis not present

## 2023-12-08 HISTORY — DX: Pneumonia, unspecified organism: J18.9

## 2023-12-08 HISTORY — DX: Myoneural disorder, unspecified: G70.9

## 2023-12-08 LAB — BASIC METABOLIC PANEL WITH GFR
Anion gap: 16 — ABNORMAL HIGH (ref 5–15)
BUN: 13 mg/dL (ref 8–23)
CO2: 20 mmol/L — ABNORMAL LOW (ref 22–32)
Calcium: 10.2 mg/dL (ref 8.9–10.3)
Chloride: 106 mmol/L (ref 98–111)
Creatinine, Ser: 1.28 mg/dL — ABNORMAL HIGH (ref 0.61–1.24)
GFR, Estimated: 59 mL/min — ABNORMAL LOW (ref >60)
Glucose, Bld: 158 mg/dL — ABNORMAL HIGH (ref 70–99)
Potassium: 3.6 mmol/L (ref 3.5–5.1)
Sodium: 143 mmol/L (ref 135–145)

## 2023-12-08 LAB — GLUCOSE, CAPILLARY: Glucose-Capillary: 172 mg/dL — ABNORMAL HIGH (ref 70–99)

## 2023-12-08 LAB — CBC
HCT: 47.1 % (ref 39.0–52.0)
Hemoglobin: 15.3 g/dL (ref 13.0–17.0)
MCH: 28.8 pg (ref 26.0–34.0)
MCHC: 32.5 g/dL (ref 30.0–36.0)
MCV: 88.7 fL (ref 80.0–100.0)
Platelets: 249 K/uL (ref 150–400)
RBC: 5.31 MIL/uL (ref 4.22–5.81)
RDW: 14.2 % (ref 11.5–15.5)
WBC: 13.6 K/uL — ABNORMAL HIGH (ref 4.0–10.5)
nRBC: 0 % (ref 0.0–0.2)

## 2023-12-08 LAB — SURGICAL PCR SCREEN
MRSA, PCR: NEGATIVE
Staphylococcus aureus: NEGATIVE

## 2023-12-08 LAB — HEMOGLOBIN A1C
Hgb A1c MFr Bld: 6.3 % — ABNORMAL HIGH (ref 4.8–5.6)
Mean Plasma Glucose: 134.11 mg/dL
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
## 2023-12-15 NOTE — Anesthesia Preprocedure Evaluation (Signed)
 Anesthesia Evaluation  Patient identified by MRN, date of birth, ID band Patient awake    Reviewed: Allergy & Precautions, NPO status , Patient's Chart, lab work & pertinent test results  History of Anesthesia Complications Negative for: history of anesthetic complications  Airway Mallampati: II  TM Distance: >3 FB Neck ROM: Full    Dental  (+) Missing,    Pulmonary sleep apnea and Continuous Positive Airway Pressure Ventilation , former smoker   Pulmonary exam normal        Cardiovascular hypertension, Pt. on medications Normal cardiovascular exam     Neuro/Psych    Depression       GI/Hepatic negative GI ROS, Neg liver ROS,,,  Endo/Other  diabetes, Type 2, Oral Hypoglycemic Agents    Renal/GU Renal InsufficiencyRenal disease (Cr 1.28)     Musculoskeletal  (+) Arthritis ,    Abdominal   Peds  Hematology negative hematology ROS (+)   Anesthesia Other Findings   Reproductive/Obstetrics                              Anesthesia Physical Anesthesia Plan  ASA: 2  Anesthesia Plan: Spinal   Post-op Pain Management: Tylenol  PO (pre-op)* and Regional block*   Induction:   PONV Risk Score and Plan: 2 and Treatment may vary due to age or medical condition, Ondansetron , Propofol  infusion, Dexamethasone  and Midazolam   Airway Management Planned: Natural Airway and Simple Face Mask  Additional Equipment: None  Intra-op Plan:   Post-operative Plan:   Informed Consent: I have reviewed the patients History and Physical, chart, labs and discussed the procedure including the risks, benefits and alternatives for the proposed anesthesia with the patient or authorized representative who has indicated his/her understanding and acceptance.       Plan Discussed with: CRNA  Anesthesia Plan Comments:          Anesthesia Quick Evaluation

## 2023-12-16 ENCOUNTER — Ambulatory Visit (HOSPITAL_COMMUNITY)

## 2023-12-16 ENCOUNTER — Ambulatory Visit (HOSPITAL_COMMUNITY): Admitting: Anesthesiology

## 2023-12-16 ENCOUNTER — Encounter (HOSPITAL_COMMUNITY)
Admission: RE | Disposition: A | Discharge status: Home / Self Care | Payer: Self-pay | Source: Home / Self Care | Attending: Orthopedic Surgery

## 2023-12-16 ENCOUNTER — Other Ambulatory Visit: Payer: Self-pay

## 2023-12-16 ENCOUNTER — Ambulatory Visit (HOSPITAL_COMMUNITY)
Admission: RE | Admit: 2023-12-16 | Discharge: 2023-12-17 | Disposition: A | Discharge status: Home / Self Care | Attending: Orthopedic Surgery | Admitting: Orthopedic Surgery

## 2023-12-16 ENCOUNTER — Encounter (HOSPITAL_COMMUNITY): Payer: Self-pay | Admitting: Orthopedic Surgery

## 2023-12-16 DIAGNOSIS — F32A Depression, unspecified: Secondary | ICD-10-CM | POA: Diagnosis not present

## 2023-12-16 DIAGNOSIS — N289 Disorder of kidney and ureter, unspecified: Secondary | ICD-10-CM | POA: Insufficient documentation

## 2023-12-16 DIAGNOSIS — G4733 Obstructive sleep apnea (adult) (pediatric): Secondary | ICD-10-CM | POA: Insufficient documentation

## 2023-12-16 DIAGNOSIS — E119 Type 2 diabetes mellitus without complications: Secondary | ICD-10-CM | POA: Insufficient documentation

## 2023-12-16 DIAGNOSIS — M1711 Unilateral primary osteoarthritis, right knee: Secondary | ICD-10-CM | POA: Diagnosis present

## 2023-12-16 DIAGNOSIS — I10 Essential (primary) hypertension: Secondary | ICD-10-CM | POA: Diagnosis not present

## 2023-12-16 DIAGNOSIS — Z79899 Other long term (current) drug therapy: Secondary | ICD-10-CM | POA: Insufficient documentation

## 2023-12-16 DIAGNOSIS — Z96651 Presence of right artificial knee joint: Secondary | ICD-10-CM

## 2023-12-16 DIAGNOSIS — Z7984 Long term (current) use of oral hypoglycemic drugs: Secondary | ICD-10-CM | POA: Insufficient documentation

## 2023-12-16 DIAGNOSIS — Z87891 Personal history of nicotine dependence: Secondary | ICD-10-CM | POA: Insufficient documentation

## 2023-12-16 DIAGNOSIS — Z01818 Encounter for other preprocedural examination: Secondary | ICD-10-CM

## 2023-12-16 HISTORY — PX: KNEE ARTHROPLASTY: SHX992

## 2023-12-16 LAB — GLUCOSE, CAPILLARY
Glucose-Capillary: 142 mg/dL — ABNORMAL HIGH (ref 70–99)
Glucose-Capillary: 161 mg/dL — ABNORMAL HIGH (ref 70–99)
Glucose-Capillary: 271 mg/dL — ABNORMAL HIGH (ref 70–99)
Glucose-Capillary: 247 mg/dL — ABNORMAL HIGH (ref 70–99)

## 2023-12-16 SURGERY — ARTHROPLASTY, KNEE, TOTAL, USING IMAGELESS COMPUTER-ASSISTED NAVIGATION
Anesthesia: Spinal | Site: Knee | Laterality: Right

## 2023-12-16 MED ORDER — DEXAMETHASONE SODIUM PHOSPHATE 4 MG/ML IJ SOLN
INTRAMUSCULAR | Status: DC | PRN
  Administered 2023-12-16: 4 mg via INTRAVENOUS

## 2023-12-16 MED ORDER — ISOPROPYL ALCOHOL 70 % SOLN
Status: AC
  Filled 2023-12-16: qty 480

## 2023-12-16 MED ORDER — OXYCODONE HCL 5 MG PO TABS
ORAL_TABLET | ORAL | Status: AC
  Filled 2023-12-16: qty 1

## 2023-12-16 MED ORDER — MIDAZOLAM HCL 5 MG/5ML IJ SOLN
INTRAMUSCULAR | Status: DC | PRN
  Administered 2023-12-16 (×2): 1 mg via INTRAVENOUS

## 2023-12-16 MED ORDER — ISOPROPYL ALCOHOL 70 % SOLN
Status: DC | PRN
  Administered 2023-12-16: 1 via TOPICAL

## 2023-12-16 MED ORDER — LACTATED RINGERS IV SOLN: INTRAVENOUS | Status: DC

## 2023-12-16 MED ORDER — PHENOL 1.4 % MT LIQD: 1.0000 | OROMUCOSAL | Status: DC | PRN

## 2023-12-16 MED ORDER — FENTANYL CITRATE (PF) 100 MCG/2ML IJ SOLN
INTRAMUSCULAR | Status: AC
  Filled 2023-12-16: qty 2

## 2023-12-16 MED ORDER — PANTOPRAZOLE SODIUM 40 MG PO TBEC
40.0000 mg | DELAYED_RELEASE_TABLET | Freq: Every day | ORAL | Status: DC
  Administered 2023-12-16 – 2023-12-17 (×2): 40 mg via ORAL
  Filled 2023-12-16 (×2): qty 1

## 2023-12-16 MED ORDER — MENTHOL 3 MG MT LOZG: 1.0000 | LOZENGE | OROMUCOSAL | Status: DC | PRN

## 2023-12-16 MED ORDER — ONDANSETRON HCL 4 MG PO TABS: 4.0000 mg | ORAL_TABLET | Freq: Four times a day (QID) | ORAL | Status: DC | PRN

## 2023-12-16 MED ORDER — LACTATED RINGERS IV SOLN
INTRAVENOUS | Status: DC
Start: 2023-12-16 — End: 2023-12-16

## 2023-12-16 MED ORDER — SENNA 8.6 MG PO TABS
1.0000 | ORAL_TABLET | Freq: Two times a day (BID) | ORAL | Status: DC
  Administered 2023-12-16 – 2023-12-17 (×2): 8.6 mg via ORAL
  Filled 2023-12-16 (×2): qty 1

## 2023-12-16 MED ORDER — STERILE WATER FOR IRRIGATION IR SOLN
Status: DC | PRN
  Administered 2023-12-16: 2000 mL

## 2023-12-16 MED ORDER — METHOCARBAMOL 500 MG PO TABS
500.0000 mg | ORAL_TABLET | Freq: Four times a day (QID) | ORAL | Status: DC | PRN
  Administered 2023-12-16 – 2023-12-17 (×2): 500 mg via ORAL
  Filled 2023-12-16 (×2): qty 1

## 2023-12-16 MED ORDER — ORAL CARE MOUTH RINSE: 15.0000 mL | Freq: Once | OROMUCOSAL | Status: AC

## 2023-12-16 MED ORDER — HYDROMORPHONE HCL 1 MG/ML IJ SOLN: 0.5000 mg | INTRAMUSCULAR | Status: DC | PRN

## 2023-12-16 MED ORDER — DEXMEDETOMIDINE HCL IN NACL 80 MCG/20ML IV SOLN
INTRAVENOUS | Status: DC | PRN
Start: 2023-12-16 — End: 2023-12-16
  Administered 2023-12-16: 12 ug via INTRAVENOUS

## 2023-12-16 MED ORDER — DIPHENHYDRAMINE HCL 12.5 MG/5ML PO ELIX: 12.5000 mg | ORAL_SOLUTION | ORAL | Status: DC | PRN

## 2023-12-16 MED ORDER — PHENYLEPHRINE HCL-NACL 20-0.9 MG/250ML-% IV SOLN
INTRAVENOUS | Status: DC | PRN
  Administered 2023-12-16: 50 ug/min via INTRAVENOUS

## 2023-12-16 MED ORDER — METOCLOPRAMIDE HCL 5 MG PO TABS: 5.0000 mg | ORAL_TABLET | Freq: Three times a day (TID) | ORAL | Status: DC | PRN

## 2023-12-16 MED ORDER — FENTANYL CITRATE (PF) 100 MCG/2ML IJ SOLN
INTRAMUSCULAR | Status: DC | PRN
  Administered 2023-12-16 (×4): 50 ug via INTRAVENOUS

## 2023-12-16 MED ORDER — HYDROMORPHONE HCL 1 MG/ML IJ SOLN: 0.2500 mg | INTRAMUSCULAR | Status: DC | PRN

## 2023-12-16 MED ORDER — INSULIN ASPART 100 UNIT/ML IJ SOLN
0.0000 [IU] | Freq: Three times a day (TID) | INTRAMUSCULAR | Status: DC
  Administered 2023-12-16: 8 [IU] via SUBCUTANEOUS
  Administered 2023-12-17: 2 [IU] via SUBCUTANEOUS
  Administered 2023-12-17: 3 [IU] via SUBCUTANEOUS

## 2023-12-16 MED ORDER — GLYCOPYRROLATE 0.2 MG/ML IJ SOLN
INTRAMUSCULAR | Status: DC | PRN
  Administered 2023-12-16: .2 mg via INTRAVENOUS

## 2023-12-16 MED ORDER — POLYETHYLENE GLYCOL 3350 17 G PO PACK: 17.0000 g | PACK | Freq: Every day | ORAL | Status: DC | PRN

## 2023-12-16 MED ORDER — SODIUM CHLORIDE (PF) 0.9 % IJ SOLN
INTRAMUSCULAR | Status: AC
  Filled 2023-12-16: qty 30

## 2023-12-16 MED ORDER — ACETAMINOPHEN 325 MG PO TABS: 325.0000 mg | ORAL_TABLET | Freq: Four times a day (QID) | ORAL | Status: DC | PRN

## 2023-12-16 MED ORDER — FLUOXETINE HCL 20 MG PO CAPS
40.0000 mg | ORAL_CAPSULE | Freq: Every day | ORAL | Status: DC
Start: 2023-12-17 — End: 2023-12-17
  Administered 2023-12-17: 40 mg via ORAL
  Filled 2023-12-16: qty 2

## 2023-12-16 MED ORDER — DEXAMETHASONE SODIUM PHOSPHATE 10 MG/ML IJ SOLN
INTRAMUSCULAR | Status: AC
  Filled 2023-12-16: qty 1

## 2023-12-16 MED ORDER — ACETAMINOPHEN 500 MG PO TABS
1000.0000 mg | ORAL_TABLET | Freq: Once | ORAL | Status: AC
Start: 2023-12-16 — End: 2023-12-16
  Administered 2023-12-16: 1000 mg via ORAL
  Filled 2023-12-16: qty 2

## 2023-12-16 MED ORDER — INSULIN ASPART 100 UNIT/ML IJ SOLN: 0.0000 [IU] | INTRAMUSCULAR | Status: DC | PRN

## 2023-12-16 MED ORDER — SODIUM CHLORIDE (PF) 0.9 % IJ SOLN
INTRAMUSCULAR | Status: DC | PRN
  Administered 2023-12-16: 61 mL via SURGICAL_CAVITY

## 2023-12-16 MED ORDER — OXYCODONE HCL 5 MG PO TABS
5.0000 mg | ORAL_TABLET | ORAL | Status: DC | PRN
  Administered 2023-12-16: 5 mg via ORAL
  Administered 2023-12-17 (×2): 10 mg via ORAL
  Filled 2023-12-16 (×2): qty 2
  Filled 2023-12-16: qty 1
  Filled 2023-12-16: qty 2

## 2023-12-16 MED ORDER — PROPOFOL 1000 MG/100ML IV EMUL
INTRAVENOUS | Status: AC
  Filled 2023-12-16: qty 100

## 2023-12-16 MED ORDER — BUPIVACAINE IN DEXTROSE 0.75-8.25 % IT SOLN
INTRATHECAL | Status: DC | PRN
  Administered 2023-12-16: 2 mL via INTRATHECAL

## 2023-12-16 MED ORDER — BUPIVACAINE-EPINEPHRINE (PF) 0.25% -1:200000 IJ SOLN
INTRAMUSCULAR | Status: AC
  Filled 2023-12-16: qty 30

## 2023-12-16 MED ORDER — CHLORHEXIDINE GLUCONATE 0.12 % MT SOLN
15.0000 mL | Freq: Once | OROMUCOSAL | Status: AC
  Administered 2023-12-16: 15 mL via OROMUCOSAL

## 2023-12-16 MED ORDER — POVIDONE-IODINE 10 % EX SWAB: 2.0000 | Freq: Once | CUTANEOUS | Status: DC

## 2023-12-16 MED ORDER — INSULIN ASPART 100 UNIT/ML IJ SOLN
0.0000 [IU] | Freq: Every day | INTRAMUSCULAR | Status: DC
  Administered 2023-12-16: 2 [IU] via SUBCUTANEOUS

## 2023-12-16 MED ORDER — PHENYLEPHRINE HCL-NACL 20-0.9 MG/250ML-% IV SOLN
INTRAVENOUS | Status: AC
  Filled 2023-12-16: qty 500

## 2023-12-16 MED ORDER — ACETAMINOPHEN 500 MG PO TABS
1000.0000 mg | ORAL_TABLET | Freq: Four times a day (QID) | ORAL | Status: AC
  Administered 2023-12-16 – 2023-12-17 (×4): 1000 mg via ORAL
  Filled 2023-12-16 (×3): qty 2

## 2023-12-16 MED ORDER — DOCUSATE SODIUM 100 MG PO CAPS
100.0000 mg | ORAL_CAPSULE | Freq: Two times a day (BID) | ORAL | Status: DC
  Administered 2023-12-16 – 2023-12-17 (×2): 100 mg via ORAL
  Filled 2023-12-16 (×2): qty 1

## 2023-12-16 MED ORDER — DROPERIDOL 2.5 MG/ML IJ SOLN: 0.6250 mg | Freq: Once | INTRAMUSCULAR | Status: DC | PRN

## 2023-12-16 MED ORDER — LACTATED RINGERS IV SOLN: INTRAVENOUS | Status: DC | PRN

## 2023-12-16 MED ORDER — LIDOCAINE HCL (PF) 2 % IJ SOLN
INTRAMUSCULAR | Status: AC
  Filled 2023-12-16: qty 5

## 2023-12-16 MED ORDER — 0.9 % SODIUM CHLORIDE (POUR BTL) OPTIME
TOPICAL | Status: DC | PRN
  Administered 2023-12-16: 1000 mL

## 2023-12-16 MED ORDER — PROPOFOL 10 MG/ML IV BOLUS
INTRAVENOUS | Status: AC
Start: 2023-12-16 — End: 2023-12-16
  Filled 2023-12-16: qty 20

## 2023-12-16 MED ORDER — PROPOFOL 500 MG/50ML IV EMUL
INTRAVENOUS | Status: DC | PRN
  Administered 2023-12-16: 100 ug/kg/min via INTRAVENOUS
  Administered 2023-12-16: 30 mg via INTRAVENOUS
  Administered 2023-12-16: 100 ug via INTRAVENOUS
  Administered 2023-12-16: 50 mg via INTRAVENOUS

## 2023-12-16 MED ORDER — BISACODYL 10 MG RE SUPP: 10.0000 mg | Freq: Every day | RECTAL | Status: DC | PRN

## 2023-12-16 MED ORDER — TOPIRAMATE 25 MG PO TABS
50.0000 mg | ORAL_TABLET | Freq: Every day | ORAL | Status: DC
  Administered 2023-12-16 – 2023-12-17 (×2): 50 mg via ORAL
  Filled 2023-12-16 (×2): qty 2

## 2023-12-16 MED ORDER — SODIUM CHLORIDE 0.9 % IV SOLN: INTRAVENOUS | Status: DC

## 2023-12-16 MED ORDER — ASPIRIN 81 MG PO CHEW
81.0000 mg | CHEWABLE_TABLET | Freq: Two times a day (BID) | ORAL | Status: DC
  Administered 2023-12-16 – 2023-12-17 (×2): 81 mg via ORAL
  Filled 2023-12-16 (×2): qty 1

## 2023-12-16 MED ORDER — OXYCODONE HCL 5 MG PO TABS: 10.0000 mg | ORAL_TABLET | ORAL | Status: DC | PRN

## 2023-12-16 MED ORDER — ACETAMINOPHEN 500 MG PO TABS
ORAL_TABLET | ORAL | Status: AC
Start: 2023-12-16 — End: 2023-12-16
  Filled 2023-12-16: qty 2

## 2023-12-16 MED ORDER — ACETAMINOPHEN 500 MG PO TABS: 1000.0000 mg | ORAL_TABLET | Freq: Once | ORAL | Status: DC

## 2023-12-16 MED ORDER — METOCLOPRAMIDE HCL 5 MG/ML IJ SOLN: 5.0000 mg | Freq: Three times a day (TID) | INTRAMUSCULAR | Status: DC | PRN

## 2023-12-16 MED ORDER — MIDAZOLAM HCL 2 MG/2ML IJ SOLN
INTRAMUSCULAR | Status: AC
  Filled 2023-12-16: qty 2

## 2023-12-16 MED ORDER — ONDANSETRON HCL 4 MG/2ML IJ SOLN: 4.0000 mg | Freq: Four times a day (QID) | INTRAMUSCULAR | Status: DC | PRN

## 2023-12-16 MED ORDER — PROPOFOL 500 MG/50ML IV EMUL
INTRAVENOUS | Status: AC
  Filled 2023-12-16: qty 50

## 2023-12-16 MED ORDER — ROSUVASTATIN CALCIUM 20 MG PO TABS
20.0000 mg | ORAL_TABLET | Freq: Every evening | ORAL | Status: DC
  Administered 2023-12-16: 20 mg via ORAL
  Filled 2023-12-16: qty 1

## 2023-12-16 MED ORDER — ONDANSETRON HCL 4 MG/2ML IJ SOLN
INTRAMUSCULAR | Status: DC | PRN
  Administered 2023-12-16: 4 mg via INTRAVENOUS

## 2023-12-16 MED ORDER — BUPIVACAINE-EPINEPHRINE (PF) 0.5% -1:200000 IJ SOLN
INTRAMUSCULAR | Status: DC | PRN
  Administered 2023-12-16: 15 mL via PERINEURAL

## 2023-12-16 MED ORDER — ACETAMINOPHEN 10 MG/ML IV SOLN
INTRAVENOUS | Status: AC
  Filled 2023-12-16: qty 200

## 2023-12-16 MED ORDER — METHOCARBAMOL 1000 MG/10ML IJ SOLN: 500.0000 mg | Freq: Four times a day (QID) | INTRAMUSCULAR | Status: DC | PRN

## 2023-12-16 MED ORDER — SODIUM CHLORIDE 0.9 % IR SOLN
Status: DC | PRN
  Administered 2023-12-16: 3000 mL

## 2023-12-16 MED ORDER — CEFAZOLIN SODIUM-DEXTROSE 2-4 GM/100ML-% IV SOLN
2.0000 g | Freq: Four times a day (QID) | INTRAVENOUS | Status: AC
  Administered 2023-12-16 (×2): 2 g via INTRAVENOUS
  Filled 2023-12-16 (×2): qty 100

## 2023-12-16 MED ORDER — DEXMEDETOMIDINE HCL IN NACL 80 MCG/20ML IV SOLN
INTRAVENOUS | Status: AC
  Filled 2023-12-16: qty 20

## 2023-12-16 MED ORDER — CEFAZOLIN SODIUM-DEXTROSE 2-4 GM/100ML-% IV SOLN
2.0000 g | INTRAVENOUS | Status: AC
  Administered 2023-12-16: 2 g via INTRAVENOUS
  Filled 2023-12-16: qty 100

## 2023-12-16 MED ORDER — TRANEXAMIC ACID-NACL 1000-0.7 MG/100ML-% IV SOLN
1000.0000 mg | INTRAVENOUS | Status: AC
Start: 2023-12-16 — End: 2023-12-16
  Administered 2023-12-16: 1000 mg via INTRAVENOUS
  Filled 2023-12-16: qty 100

## 2023-12-16 MED ORDER — ALUM & MAG HYDROXIDE-SIMETH 200-200-20 MG/5ML PO SUSP: 30.0000 mL | ORAL | Status: DC | PRN

## 2023-12-16 MED ORDER — ONDANSETRON HCL 4 MG/2ML IJ SOLN
INTRAMUSCULAR | Status: AC
  Filled 2023-12-16: qty 2

## 2023-12-16 MED ORDER — CLONIDINE HCL (ANALGESIA) 100 MCG/ML EP SOLN
EPIDURAL | Status: DC | PRN
  Administered 2023-12-16: 100 ug

## 2023-12-16 MED ORDER — KETOROLAC TROMETHAMINE 30 MG/ML IJ SOLN
INTRAMUSCULAR | Status: AC
  Filled 2023-12-16: qty 1

## 2023-12-16 SURGICAL SUPPLY — 59 items
BAG COUNTER SPONGE SURGICOUNT (BAG) IMPLANT
BAG ZIPLOCK 12X15 (MISCELLANEOUS) ×1 IMPLANT
BATTERY INSTRU NAVIGATION (MISCELLANEOUS) ×3 IMPLANT
BLADE SAG 18X100X1.27 (BLADE) IMPLANT
BLADE SAW RECIPROCATING 77.5 (BLADE) IMPLANT
BLADE SAW SAG 35X64 .89 (BLADE) IMPLANT
BLADE SAW SGTL 13.0X1.19X90.0M (BLADE) IMPLANT
BNDG ELASTIC 4INX 5YD STR LF (GAUZE/BANDAGES/DRESSINGS) ×1 IMPLANT
BNDG ELASTIC 6INX 5YD STR LF (GAUZE/BANDAGES/DRESSINGS) ×1 IMPLANT
CHLORAPREP W/TINT 26 (MISCELLANEOUS) ×2 IMPLANT
COMPONENT FEM CMT PS STD 11 RT (Joint) IMPLANT
COMPONENT PATELLA 3 PEG 35 (Joint) IMPLANT
COMPONENT TIB KNEE PS G 0 RT (Joint) IMPLANT
COVER SURGICAL LIGHT HANDLE (MISCELLANEOUS) ×1 IMPLANT
DERMABOND ADVANCED .7 DNX12 (GAUZE/BANDAGES/DRESSINGS) ×2 IMPLANT
DRAPE SHEET LG 3/4 BI-LAMINATE (DRAPES) ×3 IMPLANT
DRAPE U-SHAPE 47X51 STRL (DRAPES) ×1 IMPLANT
DRSG AQUACEL AG ADV 3.5X10 (GAUZE/BANDAGES/DRESSINGS) ×1 IMPLANT
ELECT BLADE TIP CTD 4 INCH (ELECTRODE) ×1 IMPLANT
ELECT PENCIL ROCKER SW 15FT (MISCELLANEOUS) ×1 IMPLANT
ELECT REM PT RETURN 15FT ADLT (MISCELLANEOUS) ×1 IMPLANT
GAUZE SPONGE 4X4 12PLY STRL (GAUZE/BANDAGES/DRESSINGS) ×1 IMPLANT
GLOVE BIO SURGEON STRL SZ7 (GLOVE) ×1 IMPLANT
GLOVE BIO SURGEON STRL SZ8.5 (GLOVE) ×2 IMPLANT
GLOVE BIOGEL PI IND STRL 7.5 (GLOVE) ×1 IMPLANT
GLOVE BIOGEL PI IND STRL 8.5 (GLOVE) ×1 IMPLANT
GOWN SPEC L3 XXLG W/TWL (GOWN DISPOSABLE) ×1 IMPLANT
GOWN STRL REUS W/ TWL XL LVL3 (GOWN DISPOSABLE) ×1 IMPLANT
HOLDER FOLEY CATH W/STRAP (MISCELLANEOUS) ×1 IMPLANT
HOOD PEEL AWAY T7 (MISCELLANEOUS) ×3 IMPLANT
INSERT TIB ASF PS 7-12 10 GH (Insert) IMPLANT
KIT TURNOVER KIT A (KITS) ×1 IMPLANT
MARKER SKIN DUAL TIP RULER LAB (MISCELLANEOUS) ×1 IMPLANT
NDL SAFETY ECLIPSE 18X1.5 (NEEDLE) ×1 IMPLANT
NDL SPNL 18GX3.5 QUINCKE PK (NEEDLE) ×1 IMPLANT
NEEDLE SPNL 18GX3.5 QUINCKE PK (NEEDLE) ×1 IMPLANT
NS IRRIG 1000ML POUR BTL (IV SOLUTION) ×1 IMPLANT
PACK TOTAL KNEE CUSTOM (KITS) ×1 IMPLANT
PADDING CAST COTTON 6X4 STRL (CAST SUPPLIES) ×1 IMPLANT
PIN DRILL HDLS TROCAR 75 4PK (PIN) IMPLANT
PROTECTOR NERVE ULNAR (MISCELLANEOUS) ×1 IMPLANT
SAW OSC TIP CART 19.5X105X1.3 (SAW) IMPLANT
SCREW FEMALE HEX FIX 25X2.5 (ORTHOPEDIC DISPOSABLE SUPPLIES) IMPLANT
SEALER BIPOLAR AQUA 6.0 (INSTRUMENTS) ×1 IMPLANT
SET HNDPC FAN SPRY TIP SCT (DISPOSABLE) ×1 IMPLANT
SET PAD KNEE POSITIONER (MISCELLANEOUS) ×1 IMPLANT
SOLUTION PRONTOSAN WOUND 350ML (IRRIGATION / IRRIGATOR) ×1 IMPLANT
SPIKE FLUID TRANSFER (MISCELLANEOUS) ×2 IMPLANT
SUT MNCRL AB 3-0 PS2 18 (SUTURE) ×1 IMPLANT
SUT MON AB 2-0 CT1 36 (SUTURE) ×1 IMPLANT
SUT STRATAFIX 14 PDO 48 VLT (SUTURE) ×1 IMPLANT
SUT VIC AB 1 CTX36XBRD ANBCTR (SUTURE) ×2 IMPLANT
SUT VIC AB 2-0 CT1 TAPERPNT 27 (SUTURE) ×1 IMPLANT
SYR 3ML LL SCALE MARK (SYRINGE) ×1 IMPLANT
TOWEL GREEN STERILE FF (TOWEL DISPOSABLE) ×1 IMPLANT
TRAY FOLEY MTR SLVR 16FR STAT (SET/KITS/TRAYS/PACK) ×1 IMPLANT
TUBE SUCTION HIGH CAP CLEAR NV (SUCTIONS) ×1 IMPLANT
WATER STERILE IRR 1000ML POUR (IV SOLUTION) ×2 IMPLANT
WRAP KNEE MAXI GEL POST OP (GAUZE/BANDAGES/DRESSINGS) ×1 IMPLANT

## 2023-12-16 NOTE — Anesthesia Postprocedure Evaluation (Signed)
 Anesthesia Post Note  Patient: Brandon Park  Procedure(s) Performed: ARTHROPLASTY, KNEE, TOTAL, USING IMAGELESS COMPUTER-ASSISTED NAVIGATION (Right: Knee)     Patient location during evaluation: PACU Anesthesia Type: Spinal Level of consciousness: awake and alert Pain management: pain level controlled Vital Signs Assessment: post-procedure vital signs reviewed and stable Respiratory status: spontaneous breathing, nonlabored ventilation and respiratory function stable Cardiovascular status: blood pressure returned to baseline Postop Assessment: no apparent nausea or vomiting and spinal receding Anesthetic complications: no   No notable events documented.  Last Vitals:  Vitals:   12/16/23 1215 12/16/23 1230  BP: 107/68 107/67  Pulse: 79 66  Resp: (!) 24 19  Temp:  37.1 C  SpO2: (!) 89% 93%    Last Pain:  Vitals:   12/16/23 1239  TempSrc:   PainSc: 4                  Vertell Row

## 2023-12-16 NOTE — Anesthesia Procedure Notes (Signed)
 Spinal  Patient location during procedure: OR Start time: 12/16/2023 8:41 AM End time: 12/16/2023 8:44 AM Reason for block: surgical anesthesia Staffing Performed: anesthesiologist  Anesthesiologist: Paul Lamarr BRAVO, MD Performed by: Paul Lamarr BRAVO, MD Authorized by: Paul Lamarr BRAVO, MD   Preanesthetic Checklist Completed: patient identified, IV checked, risks and benefits discussed, surgical consent, monitors and equipment checked, pre-op evaluation and timeout performed Spinal Block Patient position: sitting Prep: DuraPrep and site prepped and draped Patient monitoring: continuous pulse ox, blood pressure and heart rate Approach: midline Location: L4-5 Injection technique: single-shot Needle Needle type: Pencan  Needle gauge: 24 G Needle length: 9 cm Assessment Events: CSF return Additional Notes Risks, benefits, and alternative discussed. Patient gave consent to procedure. Prepped and draped in sitting position. Patient sedated but responsive to voice. Clear CSF obtained after one needle pass. Positive terminal aspiration. No pain or paraesthesias with injection. Patient tolerated procedure well. Vital signs stable. LANEY Paul, MD

## 2023-12-16 NOTE — Interval H&P Note (Signed)
 History and Physical Interval Note:  12/16/2023 8:26 AM  [name]  has presented today for surgery, with the diagnosis of Right knee osteoarthritis.  The various methods of treatment have been discussed with the patient and family. After consideration of risks, benefits and other options for treatment, the patient has consented to  Procedure(s): ARTHROPLASTY, KNEE, TOTAL, USING IMAGELESS COMPUTER-ASSISTED NAVIGATION (Right) as a surgical intervention.  The patient's history has been reviewed, patient examined, no change in status, stable for surgery.  I have reviewed the patient's chart and labs.  Questions were answered to the patient's satisfaction.    The risks, benefits, and alternatives were discussed with the patient. There are risks associated with the surgery including, but not limited to, problems with anesthesia (death), infection, instability (giving out of the joint), dislocation, differences in leg length/angulation/rotation, fracture of bones, loosening or failure of implants, hematoma (blood accumulation) which may require surgical drainage, blood clots, pulmonary embolism, nerve injury (foot drop and lateral thigh numbness), and blood vessel injury. The patient understands these risks and elects to proceed.    Brandon Park

## 2023-12-16 NOTE — Evaluation (Signed)
 Physical Therapy Evaluation Patient Details Name: Brandon Park MRN: [id number] DOB: 08-Mar-1951 Today's Date: 12/16/2023  History of Present Illness  Pt is 73 yo male s/p R TKA on 12/16/23.  Pt with hx including but not limited to arthritis, gout, essential tremors, OSA, DM2, back sx  Clinical Impression  Pt is s/p TKA resulting in the deficits listed below (see PT Problem List). At baseline, pt independent and lives alone.  He has someone to stay with him at d/c and has DME.  Pt does have 3 steps with no rails.  Today, pt with good quad activation, ROM, and pain control. He ambulated 79' with RW and CGA.  Needed min cues for safety.  Pt expected to progress well.  Pt will benefit from acute skilled PT to increase their independence and safety with mobility to allow discharge.          If plan is discharge home, recommend the following: A little help with walking and/or transfers;A little help with bathing/dressing/bathroom;Assistance with cooking/housework;Help with stairs or ramp for entrance   Can travel by private vehicle        Equipment Recommendations None recommended by PT  Recommendations for Other Services       Functional Status Assessment Patient has had a recent decline in their functional status and demonstrates the ability to make significant improvements in function in a reasonable and predictable amount of time.     Precautions / Restrictions Precautions Precautions: Fall;Knee Restrictions Weight Bearing Restrictions Per Provider Order: Yes RLE Weight Bearing Per Provider Order: Weight bearing as tolerated      Mobility  Bed Mobility Overal bed mobility: Needs Assistance Bed Mobility: Supine to Sit     Supine to sit: Min assist     General bed mobility comments: min A  R LE    Transfers Overall transfer level: Needs assistance Equipment used: Rolling walker (2 wheels) Transfers: Sit to/from Stand Sit to Stand: Contact guard assist            General transfer comment: cues for hand placement and safe sitting technique    Ambulation/Gait Ambulation/Gait assistance: Contact guard assist Gait Distance (Feet): 70 Feet Assistive device: Rolling walker (2 wheels) Gait Pattern/deviations: Step-through pattern, Decreased stride length Gait velocity: functional     General Gait Details: Cues for RW proximity; pt initially taking regular steps - cued for small steps for safety on DOS  Stairs            Wheelchair Mobility     Tilt Bed    Modified Rankin (Stroke Patients Only)       Balance Overall balance assessment: Needs assistance Sitting-balance support: No upper extremity supported Sitting balance-Leahy Scale: Good     Standing balance support: Bilateral upper extremity supported, Reliant on assistive device for balance Standing balance-Leahy Scale: Poor                               Pertinent Vitals/Pain Pain Assessment Pain Assessment: 0-10 Pain Score: 1  Pain Location: R knee Pain Descriptors / Indicators: Sore Pain Intervention(s): Limited activity within patient's tolerance, Monitored during session, Repositioned, Ice applied (tolerable, had declined pain meds)    Home Living Family/patient expects to be discharged to:: Private residence Living Arrangements: Alone Available Help at Discharge: Family;Available 24 hours/day Type of Home: House Home Access: Stairs to enter Entrance Stairs-Rails: None Entrance Stairs-Number of Steps: 3   Home Layout: One level Home  Equipment: Shower seat;Grab bars - Chartered loss adjuster (2 wheels);Cane - single point      Prior Function Prior Level of Function : Independent/Modified Independent;Working/employed;Driving             Mobility Comments: No AD to ambulate       Extremity/Trunk Assessment   Upper Extremity Assessment Upper Extremity Assessment: Overall WFL for tasks assessed    Lower Extremity Assessment Lower  Extremity Assessment: LLE deficits/detail;RLE deficits/detail RLE Deficits / Details: Expected post op changes; ROM knee 5 to 85 degrees; MMT: ankle 5/5, knee and hip 3/5 not further tested LLE Deficits / Details: ROM WFL; MMT 5/5    Cervical / Trunk Assessment Cervical / Trunk Assessment: Normal  Communication        Cognition Arousal: Alert Behavior During Therapy: WFL for tasks assessed/performed   PT - Cognitive impairments: No apparent impairments                                 Cueing       General Comments      Exercises     Assessment/Plan    PT Assessment Patient needs continued PT services  PT Problem List Decreased strength;Decreased mobility;Decreased range of motion;Decreased activity tolerance;Decreased balance;Decreased knowledge of use of DME;Pain       PT Treatment Interventions DME instruction;Therapeutic exercise;Gait training;Stair training;Functional mobility training;Therapeutic activities;Patient/family education;Modalities;Balance training    PT Goals (Current goals can be found in the Care Plan section)  Acute Rehab PT Goals Patient Stated Goal: return home PT Goal Formulation: With patient/family Time For Goal Achievement: 12/30/23 Potential to Achieve Goals: Good    Frequency 7X/week     Co-evaluation               AM-PAC PT 6 Clicks Mobility  Outcome Measure Help needed turning from your back to your side while in a flat bed without using bedrails?: A Little Help needed moving from lying on your back to sitting on the side of a flat bed without using bedrails?: A Little Help needed moving to and from a bed to a chair (including a wheelchair)?: A Little Help needed standing up from a chair using your arms (e.g., wheelchair or bedside chair)?: A Little Help needed to walk in hospital room?: A Little Help needed climbing 3-5 steps with a railing? : A Little 6 Click Score: 18    End of Session Equipment Utilized  During Treatment: Gait belt Activity Tolerance: Patient tolerated treatment well Patient left: with chair alarm set;in chair;with call bell/phone within reach;with SCD's reapplied Nurse Communication: Mobility status PT Visit Diagnosis: Other abnormalities of gait and mobility (R26.89);Muscle weakness (generalized) (M62.81)    Time: 8440-8379 PT Time Calculation (min) (ACUTE ONLY): 21 min   Charges:   PT Evaluation $PT Eval Low Complexity: 1 Low   PT General Charges $$ ACUTE PT VISIT: 1 Visit         Brandon, PT Acute Rehab [facility] [phone]   Brandon Park 12/16/2023, 5:18 PM

## 2023-12-16 NOTE — Op Note (Signed)
 OPERATIVE REPORT  SURGEON: Redell Shoals, MD   ASSISTANT: Valery Potters, PA-C  PREOPERATIVE DIAGNOSIS: Primary Right knee arthritis.   POSTOPERATIVE DIAGNOSIS: Primary Right knee arthritis.   PROCEDURE: Computer assisted Right total knee arthroplasty.   IMPLANTS: Zimmer Persona PPS Cementless CR femur, size 11. Persona 0 degree Spiked Keel OsseoTi Tibia, size G. Vivacit-E polyethelyene insert, size 10 mm, CR. OsseoTi 3-Peg patella, size 35 mm.  ANESTHESIA:  MAC, Regional, and Spinal  TOURNIQUET TIME: Not utilized.   ESTIMATED BLOOD LOSS:-200 mL    ANTIBIOTICS: 2g Ancef .  DRAINS: None.  COMPLICATIONS: None   CONDITION: PACU - hemodynamically stable.   BRIEF CLINICAL NOTE: Brandon Park is a 73 y.o. male with a long-standing history of Right knee arthritis. After failing conservative management, the patient was indicated for total knee arthroplasty. The risks, benefits, and alternatives to the procedure were explained, and the patient elected to proceed.  PROCEDURE IN DETAIL: Adductor canal block was obtained in the pre-op holding area. Once inside the operative room, spinal anesthesia was obtained, and a foley catheter was inserted. The patient was then positioned and the lower extremity was prepped and draped in the normal sterile surgical fashion.  A time-out was called verifying side and site of surgery. The patient received IV antibiotics within 60 minutes of beginning the procedure. A tourniquet was not utilized.   An anterior approach to the knee was performed utilizing a midvastus arthrotomy. A medial release was performed and the patellar fat pad was excised. Stryker imageless navigation was used to cut the distal femur perpendicular to the mechanical axis. A freehand patellar resection was performed, and the patella was sized an prepared with 3 lug holes.  Nagivation was used to make a neutral proximal tibia resection, taking 10 mm of bone from the less affected lateral  side with 3 degrees of slope. The menisci were excised. A spacer block was placed, and the alignment and balance in extension were confirmed.   The distal femur was sized using the 3-degree external rotation guide referencing the posterior femoral cortex. The appropriate 4-in-1 cutting block was pinned into place. Rotation was checked using Whiteside's line, the epicondylar axis, and then confirmed with a spacer block in flexion. The remaining femoral cuts were performed, taking care to protect the MCL.  The tibia was sized and the trial tray was pinned into place. The remaining trail components were inserted. The knee was stable to varus and valgus stress through a full range of motion. The patella tracked centrally, and the PCL was well balanced. The trial components were removed, and the proximal tibial surface was prepared. Final components were impacted into place. The knee was tested for a final time and found to be well balanced.   The wound was copiously irrigated with Prontosan solution and normal saline using pulse lavage.  Marcaine  solution was injected into the periarticular soft tissue.  The wound was closed in layers using #1 Vicryl and Stratafix for the fascia, 2-0 Vicryl for the subcutaneous fat, 2-0 Monocryl for the deep dermal layer, 3-0 running Monocryl subcuticular Stitch, and 4-0 Monocryl stay sutures at both ends of the wound. Dermabond was applied to the skin.  Once the glue was fully dried, an Aquacell Ag and compressive dressing were applied.  The patient was transported to the recovery room in stable condition.  Sponge, needle, and instrument counts were correct at the end of the case x2.  The patient tolerated the procedure well and there were no known complications.  Please note that a surgical assistant was a medical necessity for this procedure in order to perform it in a safe and expeditious manner. Surgical assistant was necessary to retract the ligaments and vital  neurovascular structures to prevent injury to them and also necessary for proper positioning of the limb to allow for anatomic placement of the prosthesis.

## 2023-12-16 NOTE — Discharge Instructions (Signed)

## 2023-12-16 NOTE — Anesthesia Procedure Notes (Signed)
 Anesthesia Regional Block: Adductor canal block   Pre-Anesthetic Checklist: , timeout performed,  Correct Patient, Correct Site, Correct Laterality,  Correct Procedure, Correct Position, site marked,  Risks and benefits discussed,  Pre-op evaluation,  At surgeon's request and post-op pain management  Laterality: Right  Prep: Maximum Sterile Barrier Precautions used, chloraprep       Needles:  Injection technique: Single-shot  Needle Type: Echogenic Stimulator Needle     Needle Length: 9cm  Needle Gauge: 22     Additional Needles:   Procedures:,,,, ultrasound used (permanent image in chart),,    Narrative:  Start time: 12/16/2023 8:12 AM End time: 12/16/2023 8:15 AM Injection made incrementally with aspirations every 5 mL.  Performed by: Personally  Anesthesiologist: Paul Lamarr BRAVO, MD  Additional Notes: Risks, benefits, and alternative discussed. Patient gave consent for procedure. Patient prepped and draped in sterile fashion. Sedation administered, patient remains easily responsive to voice. Relevant anatomy identified with ultrasound guidance. Local anesthetic given in 5cc increments with no signs or symptoms of intravascular injection. No pain or paraesthesias with injection. Patient monitored throughout procedure with signs of LAST or immediate complications. Tolerated well. Ultrasound image placed in chart.  LANEY Paul, MD

## 2023-12-16 NOTE — Transfer of Care (Signed)
 Immediate Anesthesia Transfer of Care Note  Patient: Brandon Park  Procedure(s) Performed: ARTHROPLASTY, KNEE, TOTAL, USING IMAGELESS COMPUTER-ASSISTED NAVIGATION (Right: Knee)  Patient Location: PACU  Anesthesia Type:Spinal  Level of Consciousness: awake  Airway & Oxygen Therapy: Patient Spontanous Breathing and Patient connected to face mask oxygen  Post-op Assessment: Report given to RN and Post -op Vital signs reviewed and stable  Post vital signs: Reviewed and stable  Last Vitals:  Vitals Value Taken Time  BP 105/61 12/16/23 11:39  Temp    Pulse 84 12/16/23 11:41  Resp 20 12/16/23 11:41  SpO2 94 % 12/16/23 11:41  Vitals shown include unfiled device data.  Last Pain:  Vitals:   12/16/23 0640  TempSrc: Oral         Complications: No notable events documented.

## 2023-12-17 ENCOUNTER — Encounter (HOSPITAL_COMMUNITY): Payer: Self-pay | Admitting: Orthopedic Surgery

## 2023-12-17 ENCOUNTER — Other Ambulatory Visit (HOSPITAL_COMMUNITY): Payer: Self-pay

## 2023-12-17 DIAGNOSIS — M1711 Unilateral primary osteoarthritis, right knee: Secondary | ICD-10-CM | POA: Diagnosis not present

## 2023-12-17 LAB — BASIC METABOLIC PANEL WITH GFR
Anion gap: 13 (ref 5–15)
BUN: 17 mg/dL (ref 8–23)
CO2: 21 mmol/L — ABNORMAL LOW (ref 22–32)
Calcium: 8.7 mg/dL — ABNORMAL LOW (ref 8.9–10.3)
Chloride: 106 mmol/L (ref 98–111)
Creatinine, Ser: 1.15 mg/dL (ref 0.61–1.24)
GFR, Estimated: >60 mL/min (ref >60)
Glucose, Bld: 152 mg/dL — ABNORMAL HIGH (ref 70–99)
Potassium: 3.5 mmol/L (ref 3.5–5.1)
Sodium: 140 mmol/L (ref 135–145)

## 2023-12-17 LAB — CBC
HCT: 36.7 % — ABNORMAL LOW (ref 39.0–52.0)
Hemoglobin: 12.1 g/dL — ABNORMAL LOW (ref 13.0–17.0)
MCH: 29.7 pg (ref 26.0–34.0)
MCHC: 33 g/dL (ref 30.0–36.0)
MCV: 90 fL (ref 80.0–100.0)
Platelets: 213 K/uL (ref 150–400)
RBC: 4.08 MIL/uL — ABNORMAL LOW (ref 4.22–5.81)
RDW: 13.8 % (ref 11.5–15.5)
WBC: 14.1 K/uL — ABNORMAL HIGH (ref 4.0–10.5)
nRBC: 0 % (ref 0.0–0.2)

## 2023-12-17 LAB — GLUCOSE, CAPILLARY
Glucose-Capillary: 138 mg/dL — ABNORMAL HIGH (ref 70–99)
Glucose-Capillary: 164 mg/dL — ABNORMAL HIGH (ref 70–99)

## 2023-12-17 MED ORDER — ONDANSETRON HCL 4 MG PO TABS
4.0000 mg | ORAL_TABLET | Freq: Three times a day (TID) | ORAL | 0 refills | Status: DC | PRN
  Filled 2023-12-17: qty 30, 10d supply, fill #0

## 2023-12-17 MED ORDER — DOCUSATE SODIUM 100 MG PO CAPS
100.0000 mg | ORAL_CAPSULE | Freq: Two times a day (BID) | ORAL | 0 refills | Status: AC
  Filled 2023-12-17: qty 60, 30d supply, fill #0

## 2023-12-17 MED ORDER — SENNA 8.6 MG PO TABS
2.0000 | ORAL_TABLET | Freq: Every day | ORAL | 0 refills | Status: AC
  Filled 2023-12-17: qty 30, 15d supply, fill #0

## 2023-12-17 MED ORDER — ASPIRIN 81 MG PO CHEW
81.0000 mg | CHEWABLE_TABLET | Freq: Two times a day (BID) | ORAL | 0 refills | Status: AC
  Filled 2023-12-17: qty 90, 45d supply, fill #0

## 2023-12-17 MED ORDER — POLYETHYLENE GLYCOL 3350 17 GM/SCOOP PO POWD
17.0000 g | Freq: Every day | ORAL | 0 refills | Status: AC | PRN
  Filled 2023-12-17: qty 238, 14d supply, fill #0

## 2023-12-17 MED ORDER — OXYCODONE HCL 5 MG PO TABS
5.0000 mg | ORAL_TABLET | ORAL | 0 refills | Status: DC | PRN
  Filled 2023-12-17: qty 40, 7d supply, fill #0

## 2023-12-17 NOTE — Progress Notes (Signed)
 Discharge medications delivered to patient at bedside in a secure bag.

## 2023-12-17 NOTE — Progress Notes (Signed)
 Physical Therapy Treatment Patient Details Name: Brandon Park MRN: [id number] DOB: May 17, 1950 Today's Date: 12/17/2023   History of Present Illness Pt is 73 yo male s/p R TKA on 12/16/23.  Pt with hx including but not limited to arthritis, gout, essential tremors, OSA, DM2, back sx    PT Comments  Pt continues motivated and progressing well. Pt reviewed written HEP and up to negotiate stairs to gain entry to house and to use stool to high bed.  Pt eager for dc home this date.    If plan is discharge home, recommend the following: A little help with walking and/or transfers;A little help with bathing/dressing/bathroom;Assistance with cooking/housework;Help with stairs or ramp for entrance   Can travel by private vehicle        Equipment Recommendations  None recommended by PT    Recommendations for Other Services       Precautions / Restrictions Precautions Precautions: Fall;Knee Restrictions Weight Bearing Restrictions Per Provider Order: Yes RLE Weight Bearing Per Provider Order: Weight bearing as tolerated     Mobility  Bed Mobility Overal bed mobility: Needs Assistance Bed Mobility: Supine to Sit     Supine to sit: Supervision     General bed mobility comments: Pt up in chair and requests back to same    Transfers Overall transfer level: Needs assistance Equipment used: Rolling walker (2 wheels) Transfers: Sit to/from Stand Sit to Stand: Contact guard assist, Supervision           General transfer comment: cues for hand placement and safe sitting technique    Ambulation/Gait Ambulation/Gait assistance: Contact guard assist, Supervision Gait Distance (Feet): 50 Feet Assistive device: Rolling walker (2 wheels) Gait Pattern/deviations: Step-to pattern, Step-through pattern, Decreased step length - right, Decreased step length - left, Shuffle, Trunk flexed Gait velocity: functional     General Gait Details: min cues for posture, position from RW and  initial sequence   Stairs Stairs: Yes Stairs assistance: Min assist Stair Management: No rails, Step to pattern, Backwards, With walker Number of Stairs: 4 General stair comments: 2 steps twice bkwd with RW and cues for sequence and foot/RW placement   Wheelchair Mobility     Tilt Bed    Modified Rankin (Stroke Patients Only)       Balance Overall balance assessment: Needs assistance Sitting-balance support: No upper extremity supported Sitting balance-Leahy Scale: Good     Standing balance support: No upper extremity supported Standing balance-Leahy Scale: Fair                              Communication    Cognition Arousal: Alert Behavior During Therapy: WFL for tasks assessed/performed   PT - Cognitive impairments: No apparent impairments                         Following commands: Intact      Cueing    Exercises Total Joint Exercises Ankle Circles/Pumps: AROM, Both, 15 reps, Supine Quad Sets: AROM, Both, 10 reps, Supine Heel Slides: AAROM, Right, 15 reps, Supine Straight Leg Raises: AAROM, AROM, Right, 10 reps, Supine Long Arc Quad: AAROM, Right, 5 reps, Seated Goniometric ROM: AAROM R knee -5 - 55    General Comments        Pertinent Vitals/Pain Pain Assessment Pain Assessment: 0-10 Pain Score: 5  Pain Location: R knee Pain Descriptors / Indicators: Sore, Aching Pain Intervention(s): Limited activity within patient's tolerance,  Monitored during session, Premedicated before session, Repositioned, Ice applied    Home Living                          Prior Function            PT Goals (current goals can now be found in the care plan section) Acute Rehab PT Goals Patient Stated Goal: return home PT Goal Formulation: With patient/family Time For Goal Achievement: 12/30/23 Potential to Achieve Goals: Good Progress towards PT goals: Progressing toward goals    Frequency    7X/week      PT Plan       Co-evaluation              AM-PAC PT 6 Clicks Mobility   Outcome Measure  Help needed turning from your back to your side while in a flat bed without using bedrails?: A Little Help needed moving from lying on your back to sitting on the side of a flat bed without using bedrails?: A Little Help needed moving to and from a bed to a chair (including a wheelchair)?: A Little Help needed standing up from a chair using your arms (e.g., wheelchair or bedside chair)?: A Little Help needed to walk in hospital room?: A Little Help needed climbing 3-5 steps with a railing? : A Little 6 Click Score: 18    End of Session Equipment Utilized During Treatment: Gait belt Activity Tolerance: Patient tolerated treatment well Patient left: with chair alarm set;in chair;with call bell/phone within reach;with SCD's reapplied Nurse Communication: Mobility status PT Visit Diagnosis: Other abnormalities of gait and mobility (R26.89);Muscle weakness (generalized) (M62.81)     Time: 0939-1000 PT Time Calculation (min) (ACUTE ONLY): 21 min  Charges:    $Gait Training: 8-22 mins $Therapeutic Exercise: 8-22 mins $Therapeutic Activity: 8-22 mins PT General Charges $$ ACUTE PT VISIT: 1 Visit                     [facility] PT Acute Rehabilitation Services Office [phone]    Brandon Park 12/17/2023, 10:10 AM

## 2023-12-17 NOTE — Progress Notes (Signed)
 Physical Therapy Treatment Patient Details Name: Brandon Park MRN: [id number] DOB: January 25, 1951 Today's Date: 12/17/2023   History of Present Illness Pt is 73 yo male s/p R TKA on 12/16/23.  Pt with hx including but not limited to arthritis, gout, essential tremors, OSA, DM2, back sx    PT Comments  Pt motivated and progressing well with mobility.  Pt hopeful for dc this date post stair training.    If plan is discharge home, recommend the following: A little help with walking and/or transfers;A little help with bathing/dressing/bathroom;Assistance with cooking/housework;Help with stairs or ramp for entrance   Can travel by private vehicle        Equipment Recommendations  None recommended by PT    Recommendations for Other Services       Precautions / Restrictions Precautions Precautions: Fall;Knee Restrictions Weight Bearing Restrictions Per Provider Order: Yes RLE Weight Bearing Per Provider Order: Weight bearing as tolerated     Mobility  Bed Mobility Overal bed mobility: Needs Assistance Bed Mobility: Supine to Sit     Supine to sit: Supervision     General bed mobility comments: for safety    Transfers Overall transfer level: Needs assistance Equipment used: Rolling walker (2 wheels) Transfers: Sit to/from Stand Sit to Stand: Contact guard assist           General transfer comment: cues for hand placement and safe sitting technique    Ambulation/Gait Ambulation/Gait assistance: Contact guard assist Gait Distance (Feet): 150 Feet Assistive device: Rolling walker (2 wheels) Gait Pattern/deviations: Step-to pattern, Step-through pattern, Decreased step length - right, Decreased step length - left, Shuffle, Trunk flexed Gait velocity: functional     General Gait Details: cues for posture, position from RW and initial sequence   Stairs             Wheelchair Mobility     Tilt Bed    Modified Rankin (Stroke Patients Only)       Balance  Overall balance assessment: Needs assistance Sitting-balance support: No upper extremity supported Sitting balance-Leahy Scale: Good     Standing balance support: Single extremity supported Standing balance-Leahy Scale: Poor                              Communication    Cognition Arousal: Alert Behavior During Therapy: WFL for tasks assessed/performed   PT - Cognitive impairments: No apparent impairments                         Following commands: Intact      Cueing    Exercises Total Joint Exercises Ankle Circles/Pumps: AROM, Both, 15 reps, Supine Quad Sets: AROM, Both, 10 reps, Supine Heel Slides: AAROM, Right, 15 reps, Supine Straight Leg Raises: AAROM, AROM, Right, 10 reps, Supine Long Arc Quad: AAROM, Right, 5 reps, Seated Goniometric ROM: AAROM R knee -5 - 55    General Comments        Pertinent Vitals/Pain Pain Assessment Pain Assessment: 0-10 Pain Score: 6  Pain Location: R knee Pain Descriptors / Indicators: Sore Pain Intervention(s): Limited activity within patient's tolerance, Monitored during session, Ice applied (pt declining premed)    Home Living                          Prior Function            PT Goals (current goals can  now be found in the care plan section) Acute Rehab PT Goals Patient Stated Goal: return home PT Goal Formulation: With patient/family Time For Goal Achievement: 12/30/23 Potential to Achieve Goals: Good Progress towards PT goals: Progressing toward goals    Frequency    7X/week      PT Plan      Co-evaluation              AM-PAC PT 6 Clicks Mobility   Outcome Measure  Help needed turning from your back to your side while in a flat bed without using bedrails?: A Little Help needed moving from lying on your back to sitting on the side of a flat bed without using bedrails?: A Little Help needed moving to and from a bed to a chair (including a wheelchair)?: A Little Help  needed standing up from a chair using your arms (e.g., wheelchair or bedside chair)?: A Little Help needed to walk in hospital room?: A Little Help needed climbing 3-5 steps with a railing? : A Little 6 Click Score: 18    End of Session Equipment Utilized During Treatment: Gait belt Activity Tolerance: Patient tolerated treatment well Patient left: with chair alarm set;in chair;with call bell/phone within reach;with SCD's reapplied Nurse Communication: Mobility status PT Visit Diagnosis: Other abnormalities of gait and mobility (R26.89);Muscle weakness (generalized) (M62.81)     Time: 9159-9088 PT Time Calculation (min) (ACUTE ONLY): 31 min  Charges:    $Gait Training: 8-22 mins $Therapeutic Exercise: 8-22 mins PT General Charges $$ ACUTE PT VISIT: 1 Visit                     [facility] PT Acute Rehabilitation Services Office [phone]    Arilla Hice 12/17/2023, 10:05 AM

## 2023-12-17 NOTE — TOC Transition Note (Signed)
 Transition of Care [facility]) - Discharge Note   Patient Details  Name: Brandon Park MRN: [id number] Date of Birth: 03-24-1951  Transition of Care [facility]) CM/SW Contact:  NORMAN ASPEN, LCSW Phone Number: 12/17/2023, 9:30 AM   Clinical Narrative:    Met with pt who confirms he has all needed DME in the home provided by [facility].  Pt has already arranged his OPPT as well.  No further IP CM needs.   Final next level of care: OP Rehab Barriers to Discharge: No Barriers Identified   Patient Goals and CMS Choice Patient states their goals for this hospitalization and ongoing recovery are:: return home          Discharge Placement                       Discharge Plan and Services Additional resources added to the After Visit Summary for                  DME Arranged: N/A DME Agency: NA                  Social Drivers of Health (SDOH) Interventions SDOH Screenings   Food Insecurity: No Food Insecurity (12/16/2023)  Housing: High Risk (12/16/2023)  Transportation Needs: No Transportation Needs (12/16/2023)  Utilities: Not At Risk (12/16/2023)  Social Connections: Unknown (12/16/2023)  Tobacco Use: Medium Risk (12/16/2023)     Readmission Risk Interventions     No data to display

## 2023-12-17 NOTE — Progress Notes (Addendum)
    Subjective:  Patient reports pain as mild.  Denies N/V/CP/SOB. Denies tingling numbness.  Eager for d/c home. Pain well controlled with meds.  Catheter removed this am. Void pending.   Objective:   VITALS:   Vitals:   12/16/23 2208 12/17/23 0235 12/17/23 0559 12/17/23 0955  BP: 128/77 131/83 126/77 139/67  Pulse: (!) 56 76 84 75  Resp: 18 18 18 18   Temp: 98 F (36.7 C) 98 F (36.7 C) 98 F (36.7 C) 98.1 F (36.7 C)  TempSrc: Oral Oral Oral Oral  SpO2: 93% 98% 93% 97%  Weight:      Height:        NAD Neurologically intact ABD soft Neurovascular intact Sensation intact distally Intact pulses distally Dorsiflexion/Plantar flexion intact Incision: dressing C/D/I No cellulitis present Compartment soft   Lab Results  Component Value Date   WBC 14.1 (H) 12/17/2023   HGB 12.1 (L) 12/17/2023   HCT 36.7 (L) 12/17/2023   MCV 90.0 12/17/2023   PLT 213 12/17/2023   BMET    Component Value Date/Time   NA 140 12/17/2023 0328   K 3.5 12/17/2023 0328   CL 106 12/17/2023 0328   CO2 21 (L) 12/17/2023 0328   GLUCOSE 152 (H) 12/17/2023 0328   BUN 17 12/17/2023 0328   CREATININE 1.15 12/17/2023 0328   CALCIUM  8.7 (L) 12/17/2023 0328   GFRNONAA >60 12/17/2023 0328     Assessment/Plan: 1 Day Post-Op   Principal Problem:   S/P total knee arthroplasty, right   WBAT with walker DVT ppx: Aspirin , SCDs, TEDS PO pain control PT/OT: TO come today.  Dispo: d/c home once cleared with PT.    Valery GORMAN Potters 12/17/2023, 11:13 AM   [facility]  [place] [address], [place], [place] [address] Phone: [phone] [email or web address] Facebook  [employer]

## 2023-12-24 DIAGNOSIS — R110 Nausea: Secondary | ICD-10-CM | POA: Diagnosis not present

## 2023-12-24 DIAGNOSIS — Z299 Encounter for prophylactic measures, unspecified: Secondary | ICD-10-CM | POA: Diagnosis not present

## 2023-12-24 DIAGNOSIS — I10 Essential (primary) hypertension: Secondary | ICD-10-CM | POA: Diagnosis not present

## 2023-12-24 DIAGNOSIS — K5900 Constipation, unspecified: Secondary | ICD-10-CM | POA: Diagnosis not present

## 2024-02-08 NOTE — Progress Notes (Signed)
 Brandon Park                                          MRN: [id number]   02/08/2024   The [facility]I Quality Team Specialist reviewed this patient medical record for the purposes of chart review for care gap closure. The following were reviewed: chart review for care gap closure-kidney health evaluation for diabetes:eGFR  and uACR.    VBCI Quality Team

## 2024-03-29 NOTE — Progress Notes (Signed)
 Sent message, via epic in basket, requesting orders in epic from Careers adviser.

## 2024-03-30 ENCOUNTER — Ambulatory Visit: Payer: Self-pay | Admitting: Student

## 2024-03-30 DIAGNOSIS — E119 Type 2 diabetes mellitus without complications: Secondary | ICD-10-CM
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
## 2024-04-01 ENCOUNTER — Other Ambulatory Visit (HOSPITAL_COMMUNITY): Payer: Self-pay

## 2024-04-05 NOTE — Patient Instructions (Addendum)
 SURGICAL WAITING ROOM VISITATION Patients having surgery or a procedure may have no more than 2 support people in the waiting area - these visitors may rotate.    Children under the age of 41 will not be allowed to visit due to the increase in respiratory illness  Children under the age of 72 must have an adult with them who is not the patient.  If the patient needs to stay at the hospital during part of their recovery, the visitor guidelines for inpatient rooms apply. Pre-op nurse will coordinate an appropriate time for 1 support person to accompany patient in pre-op.  This support person may not rotate.    Please refer to the [facility] website for the visitor guidelines for Inpatients (after your surgery is over and you are in a regular room).       Your procedure is scheduled on: 04-13-24   Report to [facility] Main Entrance    Report to admitting at 8:30 AM   Call this number if you have problems the morning of surgery [phone]   Do not eat food :After Midnight.   After Midnight you may have the following liquids until 8:00 AM DAY OF SURGERY  Water  Non-Citrus Juices (without pulp, NO RED-Apple, White grape, White cranberry) Black Coffee (NO MILK/CREAM OR CREAMERS, sugar ok)  Clear Tea (NO MILK/CREAM OR CREAMERS, sugar ok) regular and decaf                             Plain Jell-O (NO RED)                                           Fruit ices (not with fruit pulp, NO RED)                                     Popsicles (NO RED)                                                               Sports drinks like Gatorade (NO RED)                   The day of surgery:  Drink ONE (1) Pre-Surgery G2 by 8:00 AM the morning of surgery. Drink in one sitting. Do not sip.  This drink was given to you during your hospital  pre-op appointment visit. Nothing else to drink after completing the Pre-Surgery  G2.          If you have questions, please contact your surgeons  office.   FOLLOW  ANY ADDITIONAL PRE OP INSTRUCTIONS YOU RECEIVED FROM YOUR SURGEON'S OFFICE!!!     Oral Hygiene is also important to reduce your risk of infection.                                    Remember - BRUSH YOUR TEETH THE MORNING OF SURGERY WITH YOUR REGULAR TOOTHPASTE   Do NOT smoke after Midnight  Take these medicines the morning of surgery with A SIP OF WATER :    Fluoxetine   Stop all vitamins and herbal supplements 7 days before surgery  How to Manage Your Diabetes Before and After Surgery  Why is it important to control my blood sugar before and after surgery? Improving blood sugar levels before and after surgery helps healing and can limit problems. A way of improving blood sugar control is eating a healthy diet by:  Eating less sugar and carbohydrates  Increasing activity/exercise  Talking with your doctor about reaching your blood sugar goals High blood sugars (greater than 180 mg/dL) can raise your risk of infections and slow your recovery, so you will need to focus on controlling your diabetes during the weeks before surgery. Make sure that the doctor who takes care of your diabetes knows about your planned surgery including the date and location.  How do I manage my blood sugar before surgery? Check your blood sugar at least 4 times a day, starting 2 days before surgery, to make sure that the level is not too high or low. Check your blood sugar the morning of your surgery when you wake up and every 2 hours until you get to the Short Stay unit. If your blood sugar is less than 70 mg/dL, you will need to treat for low blood sugar: Do not take insulin . Treat a low blood sugar (less than 70 mg/dL) with  cup of clear juice (cranberry or apple), 4 glucose tablets, OR glucose gel. Recheck blood sugar in 15 minutes after treatment (to make sure it is greater than 70 mg/dL). If your blood sugar is not greater than 70 mg/dL on recheck, call [phone] for further  instructions. Report your blood sugar to the short stay nurse when you get to Short Stay.  If you are admitted to the hospital after surgery: Your blood sugar will be checked by the staff and you will probably be given insulin  after surgery (instead of oral diabetes medicines) to make sure you have good blood sugar levels. The goal for blood sugar control after surgery is 80-180 mg/dL.   WHAT DO I DO ABOUT MY DIABETES MEDICATION?  Do not take oral diabetes medicines (pills) the morning of surgery (do not take Metformin, Glipizide)        Hold Jardiance 3 days before surgery (do not take after 04-09-24)       Do not take an evening dose of Glipizide the night before surgery  DO NOT TAKE THE FOLLOWING 7 DAYS PRIOR TO SURGERY: Ozempic, Wegovy, Rybelsus (Semaglutide), Byetta (exenatide), Bydureon (exenatide ER), Victoza, Saxenda (liraglutide), or Trulicity (dulaglutide) Mounjaro (Tirzepatide) Adlyxin (Lixisenatide), Polyethylene Glycol Loxenatide.  Reviewed and Endorsed by [facility] Patient Education Committee, August 2015  Bring CPAP mask and tubing day of surgery.                              You may not have any metal on your body including jewelry, and body piercing             Do not wear  lotions, powders, cologne, or deodorant              Men may shave face and neck.   Do not bring valuables to the hospital. [place] [facility] IS NOT RESPONSIBLE   FOR VALUABLES.   Contacts, dentures or bridgework may not be worn into surgery.   Bring small overnight bag day of  surgery.   DO NOT BRING YOUR HOME MEDICATIONS TO THE HOSPITAL. PHARMACY WILL DISPENSE MEDICATIONS LISTED ON YOUR MEDICATION LIST TO YOU DURING YOUR ADMISSION IN THE HOSPITAL!               Please read over the following fact sheets you were given: IF YOU HAVE QUESTIONS ABOUT YOUR PRE-OP INSTRUCTIONS PLEASE CALL [phone] Gwen  If you received a COVID test during your pre-op visit  it is requested that you wear a mask  when out in public, stay away from anyone that may not be feeling well and notify your surgeon if you develop symptoms. If you test positive for Covid or have been in contact with anyone that has tested positive in the last 10 days please notify you surgeon.   Pre-operative 4 CHG Bath Instructions  DYNA-Hex 4 Chlorhexidine  Gluconate 4% Solution Antiseptic 4 fl. oz   You can play a key role in reducing the risk of infection after surgery. Your skin needs to be as free of germs as possible. You can reduce the number of germs on your skin by washing with CHG (chlorhexidine  gluconate) soap before surgery. CHG is an antiseptic soap that kills germs and continues to kill germs even after washing.   DO NOT use if you have an allergy to chlorhexidine /CHG or antibacterial soaps. If your skin becomes reddened or irritated, stop using the CHG and notify one of our RNs at   Please shower with the CHG soap starting 4 days before surgery using the following schedule:     Please keep in mind the following:  DO NOT shave, including legs and underarms, starting the day of your first shower.   You may shave your face at any point before/day of surgery.  Place clean sheets on your bed the day you start using CHG soap. Use a clean washcloth (not used since being washed) for each shower. DO NOT sleep with pets once you start using the CHG.  CHG Shower Instructions:  If you choose to wash your hair and private area, wash first with your normal shampoo/soap.  After you use shampoo/soap, rinse your hair and body thoroughly to remove shampoo/soap residue.  Turn the water  OFF and apply about 3 tablespoons (45 ml) of CHG soap to a CLEAN washcloth.  Apply CHG soap ONLY FROM YOUR NECK DOWN TO YOUR TOES (washing for 3-5 minutes)  DO NOT use CHG soap on face, private areas, open wounds, or sores.  Pay special attention to the area where your surgery is being performed.  If you are having back surgery, having someone  wash your back for you may be helpful. Wait 2 minutes after CHG soap is applied, then you may rinse off the CHG soap.  Pat dry with a clean towel  Put on clean clothes/pajamas   If you choose to wear lotion, please use ONLY the CHG-compatible lotions on the back of this paper.     Additional instructions for the day of surgery:  Shower with regular soap the day of surgery DO NOT APPLY any lotions, deodorants, cologne, or perfumes.   Put on clean/comfortable clothes.  Brush your teeth.  Ask your nurse before applying any prescription medications to the skin.   CHG Compatible Lotions   Aveeno Moisturizing lotion  Cetaphil Moisturizing Cream  Cetaphil Moisturizing Lotion  Clairol Herbal Essence Moisturizing Lotion, Dry Skin  Clairol Herbal Essence Moisturizing Lotion, Extra Dry Skin  Clairol Herbal Essence Moisturizing Lotion, Normal Skin  Curel Age  Defying Therapeutic Moisturizing Lotion with Alpha Hydroxy  Curel Extreme Care Body Lotion  Curel Soothing Hands Moisturizing Hand Lotion  Curel Therapeutic Moisturizing Cream, Fragrance-Free  Curel Therapeutic Moisturizing Lotion, Fragrance-Free  Curel Therapeutic Moisturizing Lotion, Original Formula  Eucerin Daily Replenishing Lotion  Eucerin Dry Skin Therapy Plus Alpha Hydroxy Crme  Eucerin Dry Skin Therapy Plus Alpha Hydroxy Lotion  Eucerin Original Crme  Eucerin Original Lotion  Eucerin Plus Crme Eucerin Plus Lotion  Eucerin TriLipid Replenishing Lotion  Keri Anti-Bacterial Hand Lotion  Keri Deep Conditioning Original Lotion Dry Skin Formula Softly Scented  Keri Deep Conditioning Original Lotion, Fragrance Free Sensitive Skin Formula  Keri Lotion Fast Absorbing Fragrance Free Sensitive Skin Formula  Keri Lotion Fast Absorbing Softly Scented Dry Skin Formula  Keri Original Lotion  Keri Skin Renewal Lotion Keri Silky Smooth Lotion  Keri Silky Smooth Sensitive Skin Lotion  Nivea Body Creamy Conditioning Oil  Nivea Body Extra  Enriched Lotion  Nivea Body Original Lotion  Nivea Body Sheer Moisturizing Lotion Nivea Crme  Nivea Skin Firming Lotion  NutraDerm 30 Skin Lotion  NutraDerm Skin Lotion  NutraDerm Therapeutic Skin Cream  NutraDerm Therapeutic Skin Lotion  ProShield Protective Hand Cream  Provon moisturizing lotion   PATIENT SIGNATURE_________________________________  NURSE SIGNATURE__________________________________  ________________________________________________________________________    Nasario Exon  An incentive spirometer is a tool that can help keep your lungs clear and active. This tool measures how well you are filling your lungs with each breath. Taking long deep breaths may help reverse or decrease the chance of developing breathing (pulmonary) problems (especially infection) following: A long period of time when you are unable to move or be active. BEFORE THE PROCEDURE  If the spirometer includes an indicator to show your best effort, your nurse or respiratory therapist will set it to a desired goal. If possible, sit up straight or lean slightly forward. Try not to slouch. Hold the incentive spirometer in an upright position. INSTRUCTIONS FOR USE  Sit on the edge of your bed if possible, or sit up as far as you can in bed or on a chair. Hold the incentive spirometer in an upright position. Breathe out normally. Place the mouthpiece in your mouth and seal your lips tightly around it. Breathe in slowly and as deeply as possible, raising the piston or the ball toward the top of the column. Hold your breath for 3-5 seconds or for as long as possible. Allow the piston or ball to fall to the bottom of the column. Remove the mouthpiece from your mouth and breathe out normally. Rest for a few seconds and repeat Steps 1 through 7 at least 10 times every 1-2 hours when you are awake. Take your time and take a few normal breaths between deep breaths. The spirometer may include an  indicator to show your best effort. Use the indicator as a goal to work toward during each repetition. After each set of 10 deep breaths, practice coughing to be sure your lungs are clear. If you have an incision (the cut made at the time of surgery), support your incision when coughing by placing a pillow or rolled up towels firmly against it. Once you are able to get out of bed, walk around indoors and cough well. You may stop using the incentive spirometer when instructed by your caregiver.  RISKS AND COMPLICATIONS Take your time so you do not get dizzy or light-headed. If you are in pain, you may need to take or ask for pain medication before doing  incentive spirometry. It is harder to take a deep breath if you are having pain. AFTER USE Rest and breathe slowly and easily. It can be helpful to keep track of a log of your progress. Your caregiver can provide you with a simple table to help with this. If you are using the spirometer at home, follow these instructions: SEEK MEDICAL CARE IF:  You are having difficultly using the spirometer. You have trouble using the spirometer as often as instructed. Your pain medication is not giving enough relief while using the spirometer. You develop fever of 100.5 F (38.1 C) or higher. SEEK IMMEDIATE MEDICAL CARE IF:  You cough up bloody sputum that had not been present before. You develop fever of 102 F (38.9 C) or greater. You develop worsening pain at or near the incision site. MAKE SURE YOU:  Understand these instructions. Will watch your condition. Will get help right away if you are not doing well or get worse. Document Released: 08/04/2006 Document Revised: 06/16/2011 Document Reviewed: 10/05/2006 [facility] Patient Information 2014 [place], [place].

## 2024-04-05 NOTE — Progress Notes (Signed)
 Date of COVID positive in last 63 days:No  PCP - [facility] Cardiologist - Jayson Sierras, MD (last OV 2019)  Chest x-ray - N/A EKG - 04-06-24 Epic Stress Test - N/A ECHO - 02-24-18 Epic Cardiac Cath - N/A Pacemaker/ICD device last checked:N/A Spinal Cord Stimulator:N/A  Bowel Prep - N/A  Sleep Study - Yes, +sleep apnea CPAP - Yes sometimes  Fasting Blood Sugar - currently not checking  Checks Blood Sugar - _____ times a day  Last dose of GLP1 agonist-  N/A GLP1 instructions:  Do not take after     Jardiance  Last dose of SGLT-2 inhibitors-  N/A SGLT-2 instructions:  Do not take after 04-09-24  Blood Thinner Instructions: N/A Last dose:   Time: Aspirin  Instructions:  ASA 81  Last Dose:  Activity level:  Can go up a flight of stairs and perform activities of daily living without stopping and without symptoms of chest pain or shortness of breath.  Anesthesia review: N/A  Patient denies shortness of breath, fever, cough and chest pain at PAT appointment  Patient verbalized understanding of instructions that were given to them at the PAT appointment. Patient was also instructed that they will need to review over the PAT instructions again at home before surgery.

## 2024-04-06 ENCOUNTER — Other Ambulatory Visit: Payer: Self-pay

## 2024-04-06 ENCOUNTER — Encounter (HOSPITAL_COMMUNITY)
Admission: RE | Admit: 2024-04-06 | Discharge: 2024-04-06 | Disposition: A | Discharge status: Home / Self Care | Source: Ambulatory Visit | Attending: Orthopedic Surgery | Admitting: Orthopedic Surgery

## 2024-04-06 ENCOUNTER — Encounter (HOSPITAL_COMMUNITY): Payer: Self-pay

## 2024-04-06 VITALS — BP 145/77 | HR 100 | Temp 98.7°F | Resp 16 | Ht 67.0 in | Wt 194.4 lb

## 2024-04-06 DIAGNOSIS — E119 Type 2 diabetes mellitus without complications: Secondary | ICD-10-CM | POA: Diagnosis not present

## 2024-04-06 DIAGNOSIS — Z01818 Encounter for other preprocedural examination: Secondary | ICD-10-CM | POA: Insufficient documentation

## 2024-04-06 LAB — BASIC METABOLIC PANEL WITH GFR
Anion gap: 13 (ref 5–15)
BUN: 14 mg/dL (ref 8–23)
CO2: 22 mmol/L (ref 22–32)
Calcium: 9.9 mg/dL (ref 8.9–10.3)
Chloride: 106 mmol/L (ref 98–111)
Creatinine, Ser: 1.19 mg/dL (ref 0.61–1.24)
GFR, Estimated: >60 mL/min
Glucose, Bld: 110 mg/dL — ABNORMAL HIGH (ref 70–99)
Potassium: 3.7 mmol/L (ref 3.5–5.1)
Sodium: 141 mmol/L (ref 135–145)

## 2024-04-06 LAB — CBC
HCT: 47.6 % (ref 39.0–52.0)
Hemoglobin: 15.5 g/dL (ref 13.0–17.0)
MCH: 28.4 pg (ref 26.0–34.0)
MCHC: 32.6 g/dL (ref 30.0–36.0)
MCV: 87.3 fL (ref 80.0–100.0)
Platelets: 237 K/uL (ref 150–400)
RBC: 5.45 MIL/uL (ref 4.22–5.81)
RDW: 15.2 % (ref 11.5–15.5)
WBC: 10.2 K/uL (ref 4.0–10.5)
nRBC: 0 % (ref 0.0–0.2)

## 2024-04-06 LAB — SURGICAL PCR SCREEN
MRSA, PCR: NEGATIVE
Staphylococcus aureus: NEGATIVE

## 2024-04-06 LAB — GLUCOSE, CAPILLARY: Glucose-Capillary: 127 mg/dL — ABNORMAL HIGH (ref 70–99)

## 2024-04-12 ENCOUNTER — Encounter (HOSPITAL_COMMUNITY): Payer: Self-pay | Admitting: Orthopedic Surgery

## 2024-04-13 ENCOUNTER — Encounter (HOSPITAL_COMMUNITY): Payer: Self-pay | Admitting: Orthopedic Surgery

## 2024-04-13 ENCOUNTER — Ambulatory Visit (HOSPITAL_COMMUNITY)
Admission: RE | Admit: 2024-04-13 | Discharge: 2024-04-14 | Disposition: A | Discharge status: Home / Self Care | Source: Ambulatory Visit | Attending: Orthopedic Surgery | Admitting: Orthopedic Surgery

## 2024-04-13 ENCOUNTER — Ambulatory Visit (HOSPITAL_COMMUNITY): Admitting: Anesthesiology

## 2024-04-13 ENCOUNTER — Encounter (HOSPITAL_COMMUNITY)
Admission: RE | Disposition: A | Discharge status: Home / Self Care | Payer: Self-pay | Source: Ambulatory Visit | Attending: Orthopedic Surgery

## 2024-04-13 ENCOUNTER — Other Ambulatory Visit: Payer: Self-pay

## 2024-04-13 ENCOUNTER — Encounter (HOSPITAL_COMMUNITY): Admitting: Anesthesiology

## 2024-04-13 ENCOUNTER — Ambulatory Visit (HOSPITAL_COMMUNITY)

## 2024-04-13 DIAGNOSIS — I10 Essential (primary) hypertension: Secondary | ICD-10-CM | POA: Diagnosis not present

## 2024-04-13 DIAGNOSIS — M1712 Unilateral primary osteoarthritis, left knee: Secondary | ICD-10-CM

## 2024-04-13 DIAGNOSIS — Z7984 Long term (current) use of oral hypoglycemic drugs: Secondary | ICD-10-CM | POA: Insufficient documentation

## 2024-04-13 DIAGNOSIS — G4730 Sleep apnea, unspecified: Secondary | ICD-10-CM | POA: Diagnosis not present

## 2024-04-13 DIAGNOSIS — E119 Type 2 diabetes mellitus without complications: Secondary | ICD-10-CM | POA: Diagnosis not present

## 2024-04-13 DIAGNOSIS — Z87891 Personal history of nicotine dependence: Secondary | ICD-10-CM | POA: Insufficient documentation

## 2024-04-13 DIAGNOSIS — Z96652 Presence of left artificial knee joint: Secondary | ICD-10-CM

## 2024-04-13 HISTORY — PX: KNEE ARTHROPLASTY: SHX992

## 2024-04-13 LAB — GLUCOSE, CAPILLARY
Glucose-Capillary: 147 mg/dL — ABNORMAL HIGH (ref 70–99)
Glucose-Capillary: 175 mg/dL — ABNORMAL HIGH (ref 70–99)
Glucose-Capillary: 313 mg/dL — ABNORMAL HIGH (ref 70–99)

## 2024-04-13 LAB — HEMOGLOBIN A1C
Hgb A1c MFr Bld: 6.5 % — ABNORMAL HIGH (ref 4.8–5.6)
Mean Plasma Glucose: 139.85 mg/dL

## 2024-04-13 MED ORDER — INSULIN ASPART 100 UNIT/ML IJ SOLN
0.0000 [IU] | Freq: Three times a day (TID) | INTRAMUSCULAR | Status: DC
  Administered 2024-04-14 (×2): 3 [IU] via SUBCUTANEOUS
  Filled 2024-04-13 (×2): qty 3

## 2024-04-13 MED ORDER — ACETAMINOPHEN 500 MG PO TABS
1000.0000 mg | ORAL_TABLET | Freq: Once | ORAL | Status: AC
  Administered 2024-04-13: 1000 mg via ORAL
  Filled 2024-04-13: qty 2

## 2024-04-13 MED ORDER — HYDROMORPHONE HCL 1 MG/ML IJ SOLN: 0.5000 mg | INTRAMUSCULAR | Status: DC | PRN

## 2024-04-13 MED ORDER — CHLORHEXIDINE GLUCONATE 0.12 % MT SOLN
15.0000 mL | Freq: Once | OROMUCOSAL | Status: AC
  Administered 2024-04-13: 15 mL via OROMUCOSAL

## 2024-04-13 MED ORDER — ONDANSETRON HCL 4 MG/2ML IJ SOLN
INTRAMUSCULAR | Status: DC | PRN
  Administered 2024-04-13: 4 mg via INTRAVENOUS

## 2024-04-13 MED ORDER — CEFAZOLIN SODIUM-DEXTROSE 2-4 GM/100ML-% IV SOLN
2.0000 g | Freq: Four times a day (QID) | INTRAVENOUS | Status: AC
  Administered 2024-04-13 – 2024-04-14 (×2): 2 g via INTRAVENOUS
  Filled 2024-04-13 (×2): qty 100

## 2024-04-13 MED ORDER — ALUM & MAG HYDROXIDE-SIMETH 200-200-20 MG/5ML PO SUSP: 30.0000 mL | ORAL | Status: DC | PRN

## 2024-04-13 MED ORDER — INSULIN ASPART 100 UNIT/ML IJ SOLN
0.0000 [IU] | Freq: Every day | INTRAMUSCULAR | Status: DC
  Administered 2024-04-13: 4 [IU] via SUBCUTANEOUS
  Filled 2024-04-13: qty 4

## 2024-04-13 MED ORDER — SODIUM CHLORIDE 0.9 % IR SOLN
Status: DC | PRN
  Administered 2024-04-13: 3000 mL

## 2024-04-13 MED ORDER — POLYETHYLENE GLYCOL 3350 17 G PO PACK: 17.0000 g | PACK | Freq: Every day | ORAL | Status: DC | PRN

## 2024-04-13 MED ORDER — SODIUM CHLORIDE 0.9 % IV SOLN: INTRAVENOUS | Status: DC

## 2024-04-13 MED ORDER — PROPOFOL 10 MG/ML IV BOLUS
INTRAVENOUS | Status: DC | PRN
  Administered 2024-04-13 (×2): 20 mg via INTRAVENOUS

## 2024-04-13 MED ORDER — POVIDONE-IODINE 10 % EX SWAB: 2.0000 | Freq: Once | CUTANEOUS | Status: DC

## 2024-04-13 MED ORDER — PHENOL 1.4 % MT LIQD: 1.0000 | OROMUCOSAL | Status: DC | PRN

## 2024-04-13 MED ORDER — TOPIRAMATE 25 MG PO TABS
50.0000 mg | ORAL_TABLET | Freq: Every evening | ORAL | Status: DC
  Administered 2024-04-13: 50 mg via ORAL
  Filled 2024-04-13: qty 2

## 2024-04-13 MED ORDER — LACTATED RINGERS IV SOLN: INTRAVENOUS | Status: DC

## 2024-04-13 MED ORDER — METHOCARBAMOL 1000 MG/10ML IJ SOLN: 500.0000 mg | Freq: Four times a day (QID) | INTRAMUSCULAR | Status: DC | PRN

## 2024-04-13 MED ORDER — SODIUM CHLORIDE (PF) 0.9 % IJ SOLN
INTRAMUSCULAR | Status: DC | PRN
  Administered 2024-04-13: 61 mL

## 2024-04-13 MED ORDER — KETOROLAC TROMETHAMINE 30 MG/ML IJ SOLN
INTRAMUSCULAR | Status: AC
  Filled 2024-04-13: qty 1

## 2024-04-13 MED ORDER — BUPIVACAINE-EPINEPHRINE (PF) 0.25% -1:200000 IJ SOLN
INTRAMUSCULAR | Status: AC
  Filled 2024-04-13: qty 30

## 2024-04-13 MED ORDER — DROPERIDOL 2.5 MG/ML IJ SOLN: 0.6250 mg | Freq: Once | INTRAMUSCULAR | Status: DC | PRN

## 2024-04-13 MED ORDER — OXYCODONE HCL 5 MG PO TABS: 10.0000 mg | ORAL_TABLET | ORAL | Status: DC | PRN

## 2024-04-13 MED ORDER — DEXAMETHASONE SOD PHOSPHATE PF 10 MG/ML IJ SOLN
INTRAMUSCULAR | Status: DC | PRN
  Administered 2024-04-13: 4 mg via INTRAVENOUS

## 2024-04-13 MED ORDER — STERILE WATER FOR IRRIGATION IR SOLN
Status: DC | PRN
  Administered 2024-04-13: 2000 mL

## 2024-04-13 MED ORDER — DIPHENHYDRAMINE HCL 12.5 MG/5ML PO ELIX: 12.5000 mg | ORAL_SOLUTION | ORAL | Status: DC | PRN

## 2024-04-13 MED ORDER — PROPOFOL 500 MG/50ML IV EMUL
INTRAVENOUS | Status: AC
  Filled 2024-04-13: qty 50

## 2024-04-13 MED ORDER — METHOCARBAMOL 500 MG PO TABS
500.0000 mg | ORAL_TABLET | Freq: Four times a day (QID) | ORAL | Status: DC | PRN
  Administered 2024-04-14 (×2): 500 mg via ORAL
  Filled 2024-04-13 (×2): qty 1

## 2024-04-13 MED ORDER — SODIUM CHLORIDE (PF) 0.9 % IJ SOLN
INTRAMUSCULAR | Status: AC
  Filled 2024-04-13: qty 50

## 2024-04-13 MED ORDER — FENTANYL CITRATE (PF) 50 MCG/ML IJ SOSY
100.0000 ug | PREFILLED_SYRINGE | INTRAMUSCULAR | Status: DC
  Administered 2024-04-13: 50 ug via INTRAVENOUS
  Filled 2024-04-13: qty 2

## 2024-04-13 MED ORDER — SENNA 8.6 MG PO TABS
1.0000 | ORAL_TABLET | Freq: Two times a day (BID) | ORAL | Status: DC
  Administered 2024-04-13 – 2024-04-14 (×2): 8.6 mg via ORAL
  Filled 2024-04-13 (×2): qty 1

## 2024-04-13 MED ORDER — DOCUSATE SODIUM 100 MG PO CAPS
100.0000 mg | ORAL_CAPSULE | Freq: Two times a day (BID) | ORAL | Status: DC
  Administered 2024-04-13 – 2024-04-14 (×2): 100 mg via ORAL
  Filled 2024-04-13 (×2): qty 1

## 2024-04-13 MED ORDER — 0.9 % SODIUM CHLORIDE (POUR BTL) OPTIME
TOPICAL | Status: DC | PRN
  Administered 2024-04-13: 1000 mL

## 2024-04-13 MED ORDER — ROPIVACAINE HCL 5 MG/ML IJ SOLN
INTRAMUSCULAR | Status: DC | PRN
  Administered 2024-04-13: 30 mL via PERINEURAL

## 2024-04-13 MED ORDER — ONDANSETRON HCL 4 MG PO TABS: 4.0000 mg | ORAL_TABLET | Freq: Four times a day (QID) | ORAL | Status: DC | PRN

## 2024-04-13 MED ORDER — METOCLOPRAMIDE HCL 5 MG PO TABS: 5.0000 mg | ORAL_TABLET | Freq: Three times a day (TID) | ORAL | Status: DC | PRN

## 2024-04-13 MED ORDER — MENTHOL 3 MG MT LOZG: 1.0000 | LOZENGE | OROMUCOSAL | Status: DC | PRN

## 2024-04-13 MED ORDER — ACETAMINOPHEN 325 MG PO TABS: 325.0000 mg | ORAL_TABLET | Freq: Four times a day (QID) | ORAL | Status: DC | PRN

## 2024-04-13 MED ORDER — TRANEXAMIC ACID-NACL 1000-0.7 MG/100ML-% IV SOLN
1000.0000 mg | INTRAVENOUS | Status: AC
  Administered 2024-04-13: 1000 mg via INTRAVENOUS
  Filled 2024-04-13: qty 100

## 2024-04-13 MED ORDER — ROSUVASTATIN CALCIUM 20 MG PO TABS
20.0000 mg | ORAL_TABLET | Freq: Every evening | ORAL | Status: DC
  Administered 2024-04-13: 20 mg via ORAL
  Filled 2024-04-13: qty 1

## 2024-04-13 MED ORDER — LIDOCAINE HCL (CARDIAC) PF 100 MG/5ML IV SOSY
PREFILLED_SYRINGE | INTRAVENOUS | Status: DC | PRN
  Administered 2024-04-13: 60 mg via INTRAVENOUS

## 2024-04-13 MED ORDER — OXYCODONE HCL 5 MG PO TABS
5.0000 mg | ORAL_TABLET | ORAL | Status: DC | PRN
  Administered 2024-04-14: 5 mg via ORAL
  Filled 2024-04-13: qty 1

## 2024-04-13 MED ORDER — BUPIVACAINE IN DEXTROSE 0.75-8.25 % IT SOLN
INTRATHECAL | Status: DC | PRN
  Administered 2024-04-13: 2 mL via INTRATHECAL

## 2024-04-13 MED ORDER — ORAL CARE MOUTH RINSE: 15.0000 mL | Freq: Once | OROMUCOSAL | Status: AC

## 2024-04-13 MED ORDER — PANTOPRAZOLE SODIUM 40 MG PO TBEC
40.0000 mg | DELAYED_RELEASE_TABLET | Freq: Every day | ORAL | Status: DC
  Administered 2024-04-13 – 2024-04-14 (×2): 40 mg via ORAL
  Filled 2024-04-13 (×2): qty 1

## 2024-04-13 MED ORDER — INSULIN ASPART 100 UNIT/ML IJ SOLN: 0.0000 [IU] | INTRAMUSCULAR | Status: DC | PRN

## 2024-04-13 MED ORDER — ONDANSETRON HCL 4 MG/2ML IJ SOLN: 4.0000 mg | Freq: Four times a day (QID) | INTRAMUSCULAR | Status: DC | PRN

## 2024-04-13 MED ORDER — METOCLOPRAMIDE HCL 5 MG/ML IJ SOLN: 5.0000 mg | Freq: Three times a day (TID) | INTRAMUSCULAR | Status: DC | PRN

## 2024-04-13 MED ORDER — KETOROLAC TROMETHAMINE 15 MG/ML IJ SOLN
7.5000 mg | Freq: Four times a day (QID) | INTRAMUSCULAR | Status: AC
  Administered 2024-04-13 – 2024-04-14 (×4): 7.5 mg via INTRAVENOUS
  Filled 2024-04-13 (×4): qty 1

## 2024-04-13 MED ORDER — PROPOFOL 500 MG/50ML IV EMUL
INTRAVENOUS | Status: DC | PRN
  Administered 2024-04-13: 85 ug/kg/min via INTRAVENOUS

## 2024-04-13 MED ORDER — DEXAMETHASONE SOD PHOSPHATE PF 10 MG/ML IJ SOLN
INTRAMUSCULAR | Status: DC | PRN
  Administered 2024-04-13: 10 mg

## 2024-04-13 MED ORDER — CEFAZOLIN SODIUM-DEXTROSE 2-4 GM/100ML-% IV SOLN
2.0000 g | INTRAVENOUS | Status: AC
  Administered 2024-04-13: 2 g via INTRAVENOUS
  Filled 2024-04-13: qty 100

## 2024-04-13 MED ORDER — BISACODYL 10 MG RE SUPP: 10.0000 mg | Freq: Every day | RECTAL | Status: DC | PRN

## 2024-04-13 MED ORDER — ASPIRIN 81 MG PO CHEW
81.0000 mg | CHEWABLE_TABLET | Freq: Two times a day (BID) | ORAL | Status: DC
  Administered 2024-04-13 – 2024-04-14 (×2): 81 mg via ORAL
  Filled 2024-04-13 (×2): qty 1

## 2024-04-13 MED ORDER — MIDAZOLAM HCL (PF) 2 MG/2ML IJ SOLN: 2.0000 mg | INTRAMUSCULAR | Status: DC

## 2024-04-13 MED ORDER — ISOPROPYL ALCOHOL 70 % SOLN
Status: DC | PRN
  Administered 2024-04-13: 1 via TOPICAL

## 2024-04-13 MED ORDER — FENTANYL CITRATE (PF) 50 MCG/ML IJ SOSY: 25.0000 ug | PREFILLED_SYRINGE | INTRAMUSCULAR | Status: DC | PRN

## 2024-04-13 MED ORDER — ACETAMINOPHEN 10 MG/ML IV SOLN: 1000.0000 mg | Freq: Once | INTRAVENOUS | Status: DC | PRN

## 2024-04-13 NOTE — Anesthesia Procedure Notes (Signed)
 Anesthesia Regional Block: Adductor canal block   Pre-Anesthetic Checklist: , timeout performed,  Correct Patient, Correct Site, Correct Laterality,  Correct Procedure, Correct Position, site marked,  Risks and benefits discussed,  Surgical consent,  Pre-op evaluation,  At surgeon's request and post-op pain management  Laterality: Left  Prep: Dura Prep       Needles:  Injection technique: Single-shot  Needle Type: Echogenic Stimulator Needle     Needle Length: 10cm  Needle Gauge: 20     Additional Needles:   Procedures:,,,, ultrasound used (permanent image in chart),,    Narrative:  Start time: 04/13/2024 10:53 AM End time: 04/13/2024 10:55 AM Injection made incrementally with aspirations every 5 mL.  Performed by: Personally  Anesthesiologist: Dorethea Cordella SQUIBB, DO  Additional Notes: Patient identified. Risks/Benefits/Options discussed with patient including but not limited to bleeding, infection, nerve damage, failed block, incomplete pain control. Patient expressed understanding and wished to proceed. All questions were answered. Sterile technique was used throughout the entire procedure. Please see nursing notes for vital signs. Aspirated in 5cc intervals with injection for negative confirmation. Patient was given instructions on fall risk and not to get out of bed. All questions and concerns addressed with instructions to call with any issues or inadequate analgesia.

## 2024-04-13 NOTE — Plan of Care (Signed)
 " Problem: Education: Goal: Knowledge of General Education information will improve Description: Including pain rating scale, medication(s)/side effects and non-pharmacologic comfort measures Outcome: Progressing   Problem: Health Behavior/Discharge Planning: Goal: Ability to manage health-related needs will improve Outcome: Progressing   Problem: Clinical Measurements: Goal: Ability to maintain clinical measurements within normal limits will improve Outcome: Progressing Goal: Will remain free from infection Outcome: Progressing Goal: Diagnostic test results will improve Outcome: Progressing Goal: Respiratory complications will improve Outcome: Progressing Goal: Cardiovascular complication will be avoided Outcome: Progressing   Problem: Activity: Goal: Risk for activity intolerance will decrease Outcome: Progressing   Problem: Nutrition: Goal: Adequate nutrition will be maintained Outcome: Progressing   Problem: Coping: Goal: Level of anxiety will decrease Outcome: Progressing   Problem: Elimination: Goal: Will not experience complications related to bowel motility Outcome: Progressing Goal: Will not experience complications related to urinary retention Outcome: Progressing   Problem: Pain Managment: Goal: General experience of comfort will improve and/or be controlled Outcome: Progressing   Problem: Safety: Goal: Ability to remain free from injury will improve Outcome: Progressing   Problem: Skin Integrity: Goal: Risk for impaired skin integrity will decrease Outcome: Progressing   Problem: Education: Goal: Knowledge of the prescribed therapeutic regimen will improve Outcome: Progressing   Problem: Bowel/Gastric: Goal: Gastrointestinal status for postoperative course will improve Outcome: Progressing   Problem: Cardiac: Goal: Ability to maintain an adequate cardiac output Outcome: Progressing Goal: Will show no evidence of cardiac arrhythmias Outcome:  Progressing   Problem: Nutritional: Goal: Will attain and maintain optimal nutritional status Outcome: Progressing   Problem: Neurological: Goal: Will regain or maintain usual level of consciousness Outcome: Progressing   Problem: Clinical Measurements: Goal: Ability to maintain clinical measurements within normal limits Outcome: Progressing Goal: Postoperative complications will be avoided or minimized Outcome: Progressing   Problem: Respiratory: Goal: Will regain and/or maintain adequate ventilation Outcome: Progressing Goal: Respiratory status will improve Outcome: Progressing   Problem: Skin Integrity: Goal: Demonstrates signs of wound healing without infection Outcome: Progressing   Problem: Urinary Elimination: Goal: Will remain free from infection Outcome: Progressing Goal: Ability to achieve and maintain adequate urine output Outcome: Progressing   Problem: Education: Goal: Ability to describe self-care measures that may prevent or decrease complications (Diabetes Survival Skills Education) will improve Outcome: Progressing Goal: Individualized Educational Video(s) Outcome: Progressing   Problem: Coping: Goal: Ability to adjust to condition or change in health will improve Outcome: Progressing   Problem: Fluid Volume: Goal: Ability to maintain a balanced intake and output will improve Outcome: Progressing   Problem: Health Behavior/Discharge Planning: Goal: Ability to identify and utilize available resources and services will improve Outcome: Progressing Goal: Ability to manage health-related needs will improve Outcome: Progressing   Problem: Metabolic: Goal: Ability to maintain appropriate glucose levels will improve Outcome: Progressing   Problem: Nutritional: Goal: Maintenance of adequate nutrition will improve Outcome: Progressing Goal: Progress toward achieving an optimal weight will improve Outcome: Progressing   Problem: Skin  Integrity: Goal: Risk for impaired skin integrity will decrease Outcome: Progressing   Problem: Tissue Perfusion: Goal: Adequacy of tissue perfusion will improve Outcome: Progressing   Problem: Education: Goal: Knowledge of the prescribed therapeutic regimen will improve Outcome: Progressing Goal: Individualized Educational Video(s) Outcome: Progressing   Problem: Activity: Goal: Ability to avoid complications of mobility impairment will improve Outcome: Progressing Goal: Range of joint motion will improve Outcome: Progressing   Problem: Clinical Measurements: Goal: Postoperative complications will be avoided or minimized Outcome: Progressing   Problem: Pain Management: Goal: Pain  level will decrease with appropriate interventions Outcome: Progressing   "

## 2024-04-13 NOTE — Op Note (Signed)
 OPERATIVE REPORT  SURGEON: Redell Shoals, MD   ASSISTANT: Valery Potters, PA-C  PREOPERATIVE DIAGNOSIS: Primary Left knee arthritis.   POSTOPERATIVE DIAGNOSIS: Primary Left knee arthritis.   PROCEDURE: Computer assisted Left total knee arthroplasty.   IMPLANTS: Zimmer Persona PPS Cementless CR femur, size 11. Persona 0 degree Spiked Keel OsseoTi Tibia, size G. Vivacit-E polyethelyene insert, size 11 mm, MC. OsseoTi 3-Peg patella, size 35 mm.  ANESTHESIA:  MAC, Regional, and Spinal  TOURNIQUET TIME: Not utilized.   ESTIMATED BLOOD LOSS:-100 mL    ANTIBIOTICS: 2g Ancef .  DRAINS: None.  COMPLICATIONS: None   CONDITION: PACU - hemodynamically stable.   BRIEF CLINICAL NOTE: Brandon Park is a 74 y.o. male with a long-standing history of Left knee arthritis. After failing conservative management, the patient was indicated for total knee arthroplasty. The risks, benefits, and alternatives to the procedure were explained, and the patient elected to proceed.  PROCEDURE IN DETAIL: Adductor canal block was obtained in the pre-op holding area. Once inside the operative room, spinal anesthesia was obtained, and a foley catheter was inserted. The patient was then positioned and the lower extremity was prepped and draped in the normal sterile surgical fashion.  A time-out was called verifying side and site of surgery. The patient received IV antibiotics within 60 minutes of beginning the procedure. A tourniquet was not utilized.   An anterior approach to the knee was performed utilizing a midvastus arthrotomy. A medial release was performed and the patellar fat pad was excised. Stryker imageless navigation was used to cut the distal femur perpendicular to the mechanical axis. A freehand patellar resection was performed, and the patella was sized and prepared with 3 lug holes.  Nagivation was used to make a neutral proximal tibia resection, taking 9 mm of bone from the less affected lateral side  with 3 degrees of slope. The menisci were excised. A spacer block was placed, and the alignment and balance in extension were confirmed.   The distal femur was sized using the 3-degree external rotation guide referencing the posterior femoral cortex. The appropriate 4-in-1 cutting block was pinned into place. Rotation was checked using Whiteside's line, the epicondylar axis, and then confirmed with a spacer block in flexion. The remaining femoral cuts were performed, taking care to protect the MCL.  The tibia was sized and the trial tray was pinned into place. The remaining trail components were inserted. The knee was stable to varus and valgus stress through a full range of motion. The patella tracked centrally, and the PCL was well balanced. The trial components were removed, and the proximal tibial surface was prepared. Final components were impacted into place. The knee was tested for a final time and found to be well balanced.   The wound was copiously irrigated with Prontosan solution and normal saline using pulse lavage.  Marcaine  solution was injected into the periarticular soft tissue.  The wound was closed in layers using #1 Vicryl and Stratafix for the fascia, 2-0 Vicryl for the subcutaneous fat, 2-0 Monocryl for the deep dermal layer, 3-0 running Monocryl subcuticular Stitch, and 4-0 Monocryl stay sutures at both ends of the wound. Dermabond was applied to the skin.  Once the glue was fully dried, an Aquacell Ag and compressive dressing were applied.  The patient was transported to the recovery room in stable condition.  Sponge, needle, and instrument counts were correct at the end of the case x2.  The patient tolerated the procedure well and there were no known complications.  Please note that a surgical assistant was a medical necessity for this procedure in order to perform it in a safe and expeditious manner. Surgical assistant was necessary to retract the ligaments and vital neurovascular  structures to prevent injury to them and also necessary for proper positioning of the limb to allow for anatomic placement of the prosthesis.

## 2024-04-13 NOTE — Discharge Instructions (Signed)

## 2024-04-13 NOTE — Anesthesia Postprocedure Evaluation (Signed)
"   Anesthesia Post Note  Patient: Tallon Gertz  Procedure(s) Performed: ARTHROPLASTY, KNEE, TOTAL, USING IMAGELESS COMPUTER-ASSISTED NAVIGATION (Left: Knee)     Patient location during evaluation: PACU Anesthesia Type: Regional and Spinal Level of consciousness: oriented and awake and alert Pain management: pain level controlled Vital Signs Assessment: post-procedure vital signs reviewed and stable Respiratory status: spontaneous breathing, respiratory function stable and patient connected to nasal cannula oxygen Cardiovascular status: blood pressure returned to baseline and stable Postop Assessment: no headache, no backache and no apparent nausea or vomiting Anesthetic complications: no   No notable events documented.  Last Vitals:  Vitals:   04/13/24 1600 04/13/24 1615  BP: (!) 136/92 (!) 139/94  Pulse: 76 76  Resp: (!) 21 20  Temp:    SpO2: 91% 95%    Last Pain:  Vitals:   04/13/24 1615  TempSrc:   PainSc: 0-No pain    LLE Motor Response: Purposeful movement (04/13/24 1615) LLE Sensation: Full sensation (04/13/24 1615) RLE Motor Response: Purposeful movement (04/13/24 1615) RLE Sensation: Full sensation (04/13/24 1615)      Simrat Kendrick      "

## 2024-04-13 NOTE — Anesthesia Preprocedure Evaluation (Signed)
 "                                  Anesthesia Evaluation  Patient identified by MRN, date of birth, ID band Patient awake    Reviewed: Allergy & Precautions, NPO status , Patient's Chart, lab work & pertinent test results  Airway Mallampati: II  TM Distance: >3 FB Neck ROM: Full    Dental no notable dental hx.    Pulmonary sleep apnea , former smoker   Pulmonary exam normal        Cardiovascular hypertension, Pt. on medications  Rhythm:Regular Rate:Normal     Neuro/Psych    Depression       GI/Hepatic negative GI ROS, Neg liver ROS,,,  Endo/Other  diabetes, Type 2, Oral Hypoglycemic Agents    Renal/GU negative Renal ROS  negative genitourinary   Musculoskeletal  (+) Arthritis , Osteoarthritis,    Abdominal Normal abdominal exam  (+)   Peds  Hematology Lab Results      Component                Value               Date                      WBC                      10.2                04/06/2024                HGB                      15.5                04/06/2024                HCT                      47.6                04/06/2024                MCV                      87.3                04/06/2024                PLT                      237                 04/06/2024             Lab Results      Component                Value               Date                      NA                       141  04/06/2024                K                        3.7                 04/06/2024                CO2                      22                  04/06/2024                GLUCOSE                  110 (H)             04/06/2024                BUN                      14                  04/06/2024                CREATININE               1.19                04/06/2024                CALCIUM                   9.9                 04/06/2024                GFRNONAA                 >60                 04/06/2024              Anesthesia Other  Findings   Reproductive/Obstetrics                              Anesthesia Physical Anesthesia Plan  ASA: 2  Anesthesia Plan: MAC, Regional and Spinal   Post-op Pain Management: Regional block*   Induction: Intravenous  PONV Risk Score and Plan: 1 and Ondansetron , Dexamethasone , Propofol  infusion and Treatment may vary due to age or medical condition  Airway Management Planned: Simple Face Mask and Nasal Cannula  Additional Equipment: None  Intra-op Plan:   Post-operative Plan:   Informed Consent: I have reviewed the patients History and Physical, chart, labs and discussed the procedure including the risks, benefits and alternatives for the proposed anesthesia with the patient or authorized representative who has indicated his/her understanding and acceptance.     Dental advisory given  Plan Discussed with: CRNA  Anesthesia Plan Comments:         Anesthesia Quick Evaluation  "

## 2024-04-13 NOTE — Interval H&P Note (Signed)
 History and Physical Interval Note:  04/13/2024 11:26 AM  Brandon Park  has presented today for surgery, with the diagnosis of Left knee osteoarthritis.  The various methods of treatment have been discussed with the patient and family. After consideration of risks, benefits and other options for treatment, the patient has consented to  Procedures: ARTHROPLASTY, KNEE, TOTAL, USING IMAGELESS COMPUTER-ASSISTED NAVIGATION (Left) as a surgical intervention.  The patient's history has been reviewed, patient examined, no change in status, stable for surgery.  I have reviewed the patient's chart and labs.  Questions were answered to the patient's satisfaction.     Redell PARAS Daquisha Clermont

## 2024-04-13 NOTE — Anesthesia Procedure Notes (Signed)
 Spinal  Patient location during procedure: OR Start time: 04/13/2024 12:24 PM End time: 04/13/2024 12:26 PM  Staffing Performed: anesthesiologist  Authorized by: Dorethea Cordella SQUIBB, DO   Performed by: Dorethea Cordella SQUIBB, DO  Preanesthetic Checklist Completed: patient identified, IV checked, site marked, risks and benefits discussed, surgical consent, monitors and equipment checked, pre-op evaluation and timeout performed Spinal Block Patient position: sitting Prep: DuraPrep Patient monitoring: heart rate, cardiac monitor, continuous pulse ox and blood pressure Approach: midline Location: L3-4 Injection technique: single-shot Needle Needle type: Quincke  Needle gauge: 22 G Needle length: 12.7 cm Assessment Events: CSF return  Additional Notes Patient identified. Risks/Benefits/Options discussed with patient including but not limited to bleeding, infection, nerve damage, paralysis, failed block, incomplete pain control, headache, blood pressure changes, nausea, vomiting, reactions to medications, itching and postpartum back pain. Confirmed with bedside nurse the patient's most recent platelet count. Confirmed with patient that they are not currently taking any anticoagulation, have any bleeding history or any family history of bleeding disorders. Patient expressed understanding and wished to proceed. All questions were answered. Sterile technique was used throughout the entire procedure. Please see nursing notes for vital signs. Warning signs of high block given to the patient including shortness of breath, tingling/numbness in hands, complete motor block, or any concerning symptoms with instructions to call for help. Patient was given instructions on fall risk and not to get out of bed. All questions and concerns addressed with instructions to call with any issues or inadequate analgesia.

## 2024-04-13 NOTE — Transfer of Care (Signed)
 Immediate Anesthesia Transfer of Care Note  Patient: Brandon Park  Procedure(s) Performed: Procedures: ARTHROPLASTY, KNEE, TOTAL, USING IMAGELESS COMPUTER-ASSISTED NAVIGATION (Left)  Patient Location: PACU  Anesthesia Type:Spinal  Level of Consciousness:  sedated, patient cooperative and responds to stimulation  Airway & Oxygen Therapy:Patient Spontanous Breathing and Patient connected to face mask oxgen  Post-op Assessment:  Report given to PACU RN and Post -op Vital signs reviewed and stable  Post vital signs:  Reviewed and stable  Last Vitals:  Vitals:   04/13/24 0922  BP: (!) 145/98  Pulse: 77  Resp: 16  Temp: 36.8 C  SpO2: 94%    Complications: No apparent anesthesia complications

## 2024-04-14 ENCOUNTER — Other Ambulatory Visit (HOSPITAL_COMMUNITY): Payer: Self-pay

## 2024-04-14 DIAGNOSIS — M1712 Unilateral primary osteoarthritis, left knee: Secondary | ICD-10-CM | POA: Diagnosis not present

## 2024-04-14 LAB — CBC
HCT: 36.1 % — ABNORMAL LOW (ref 39.0–52.0)
Hemoglobin: 11.9 g/dL — ABNORMAL LOW (ref 13.0–17.0)
MCH: 28.3 pg (ref 26.0–34.0)
MCHC: 33 g/dL (ref 30.0–36.0)
MCV: 85.7 fL (ref 80.0–100.0)
Platelets: 232 K/uL (ref 150–400)
RBC: 4.21 MIL/uL — ABNORMAL LOW (ref 4.22–5.81)
RDW: 14.3 % (ref 11.5–15.5)
WBC: 16.4 K/uL — ABNORMAL HIGH (ref 4.0–10.5)
nRBC: 0 % (ref 0.0–0.2)

## 2024-04-14 LAB — GLUCOSE, CAPILLARY
Glucose-Capillary: 214 mg/dL — ABNORMAL HIGH (ref 70–99)
Glucose-Capillary: 204 mg/dL — ABNORMAL HIGH (ref 70–99)

## 2024-04-14 LAB — BASIC METABOLIC PANEL WITH GFR
Anion gap: 11 (ref 5–15)
BUN: 17 mg/dL (ref 8–23)
CO2: 23 mmol/L (ref 22–32)
Calcium: 9 mg/dL (ref 8.9–10.3)
Chloride: 107 mmol/L (ref 98–111)
Creatinine, Ser: 1.19 mg/dL (ref 0.61–1.24)
GFR, Estimated: >60 mL/min
Glucose, Bld: 275 mg/dL — ABNORMAL HIGH (ref 70–99)
Potassium: 4 mmol/L (ref 3.5–5.1)
Sodium: 141 mmol/L (ref 135–145)

## 2024-04-14 MED ORDER — ASPIRIN 81 MG PO CHEW
81.0000 mg | CHEWABLE_TABLET | Freq: Two times a day (BID) | ORAL | 0 refills | Status: AC
  Filled 2024-04-14: qty 90, 45d supply, fill #0

## 2024-04-14 MED ORDER — POLYETHYLENE GLYCOL 3350 17 GM/SCOOP PO POWD
17.0000 g | Freq: Every day | ORAL | 0 refills | Status: AC | PRN
  Filled 2024-04-14: qty 238, 14d supply, fill #0

## 2024-04-14 MED ORDER — HYDROCODONE-ACETAMINOPHEN 5-325 MG PO TABS
1.0000 | ORAL_TABLET | ORAL | 0 refills | Status: AC | PRN
  Filled 2024-04-14: qty 42, 7d supply, fill #0

## 2024-04-14 MED ORDER — DOCUSATE SODIUM 100 MG PO CAPS
100.0000 mg | ORAL_CAPSULE | Freq: Two times a day (BID) | ORAL | 0 refills | Status: AC
  Filled 2024-04-14: qty 60, 30d supply, fill #0

## 2024-04-14 MED ORDER — SENNA 8.6 MG PO TABS
2.0000 | ORAL_TABLET | Freq: Every day | ORAL | 0 refills | Status: AC
  Filled 2024-04-14: qty 30, 15d supply, fill #0

## 2024-04-14 NOTE — Discharge Summary (Signed)
 " Physician Discharge Summary  Patient ID: Brandon Park MRN: [id number] DOB/AGE: 74/12/1950 73 y.o.  Admit date: 04/13/2024 Discharge date: 04/14/2024  Admission Diagnoses:  Osteoarthritis of left knee  Discharge Diagnoses:  Principal Problem:   Osteoarthritis of left knee Active Problems:   S/P total knee arthroplasty, left   Past Medical History:  Diagnosis Date   Arthritis    Depression    Essential hypertension    Glaucoma    Gout    Insomnia    Mixed hyperlipidemia    Neuromuscular disorder (HCC)    essential tremors both hands   OSA on CPAP    Pneumonia    Type 2 diabetes mellitus (HCC)     Surgeries: Procedures: ARTHROPLASTY, KNEE, TOTAL, USING IMAGELESS COMPUTER-ASSISTED NAVIGATION on 04/13/2024   Consultants (if any):   Discharged Condition: Improved  Hospital Course: Brandon Park is an 74 y.o. male who was admitted 04/13/2024 with a diagnosis of Osteoarthritis of left knee and went to the operating room on 04/13/2024 and underwent the above named procedures.    He was given perioperative antibiotics:  Anti-infectives (From admission, onward)    Start     Dose/Rate Route Frequency Ordered Stop   04/13/24 1830  ceFAZolin  (ANCEF ) IVPB 2g/100 mL premix        2 g 200 mL/hr over 30 Minutes Intravenous Every 6 hours 04/13/24 1652 04/14/24 0134   04/13/24 0900  ceFAZolin  (ANCEF ) IVPB 2g/100 mL premix        2 g 200 mL/hr over 30 Minutes Intravenous On call to O.R. 04/13/24 0857 04/13/24 1220       He was given sequential compression devices, early ambulation, and aspirin  for DVT prophylaxis.  POD#1 Patient doing well. He ambulated well with PT 135 and 75 feet. D/c home with OPPT. Follow-up in office in 2 weeks.   He benefited maximally from the hospital stay and there were no complications.    Recent vital signs:  Vitals:   04/14/24 0127 04/14/24 0516  BP: 136/77 137/83  Pulse: 87 84  Resp: 16 18  Temp: 98.1 F (36.7 C) 98.1 F (36.7 C)  SpO2: 94%  96%    Recent laboratory studies:  Lab Results  Component Value Date   HGB 11.9 (L) 04/14/2024   HGB 15.5 04/06/2024   HGB 12.1 (L) 12/17/2023   Lab Results  Component Value Date   WBC 16.4 (H) 04/14/2024   PLT 232 04/14/2024   No results found for: INR Lab Results  Component Value Date   NA 141 04/14/2024   K 4.0 04/14/2024   CL 107 04/14/2024   CO2 23 04/14/2024   BUN 17 04/14/2024   CREATININE 1.19 04/14/2024   GLUCOSE 275 (H) 04/14/2024     Allergies as of 04/14/2024   No Known Allergies      Medication List     PAUSE taking these medications    aspirin  EC 81 MG tablet Wait to take this until your doctor or other care provider tells you to start again. Take 81 mg by mouth every evening. Swallow whole. You also have another medication with the same name that you may need to continue taking.       TAKE these medications    aspirin  81 MG chewable tablet Commonly known as: Aspirin  Childrens Chew 1 tablet (81 mg total) by mouth 2 (two) times daily with a meal. What changed: Another medication with the same name was paused. Ask your nurse or doctor if you should  take this medication.   docusate sodium  100 MG capsule Commonly known as: Colace Take 1 capsule (100 mg total) by mouth 2 (two) times daily.   empagliflozin 25 MG Tabs tablet Commonly known as: JARDIANCE Take 12.5 mg by mouth in the morning.   FLUoxetine  20 MG capsule Commonly known as: PROZAC  Take 60 mg by mouth in the morning.   glipiZIDE 10 MG tablet Commonly known as: GLUCOTROL Take 10 mg by mouth 2 (two) times daily.   HYDROcodone -acetaminophen  5-325 MG tablet Commonly known as: NORCO/VICODIN Take 1 tablet by mouth every 4 (four) hours as needed for up to 7 days for moderate pain (pain score 4-6) or severe pain (pain score 7-10).   lisinopril 20 MG tablet Commonly known as: ZESTRIL Take 20 mg by mouth in the morning.   metFORMIN 1000 MG tablet Commonly known as: GLUCOPHAGE Take  1,000 mg by mouth in the morning and at bedtime.   multivitamin with minerals Tabs tablet Take 1 tablet by mouth in the morning. Centrum for Men   polyethylene glycol 17 g packet Commonly known as: MiraLax  Take 17 g by mouth daily as needed for mild constipation or moderate constipation.   rosuvastatin  20 MG tablet Commonly known as: CRESTOR  Take 20 mg by mouth every evening.   senna 8.6 MG Tabs tablet Commonly known as: SENOKOT Take 2 tablets (17.2 mg total) by mouth at bedtime for 15 days.   topiramate  50 MG tablet Commonly known as: TOPAMAX  Take 50 mg by mouth every evening.   VITAMIN B-12 PO Take 1,000 mcg by mouth in the morning.               Discharge Care Instructions  (From admission, onward)           Start     Ordered   04/14/24 0000  Weight bearing as tolerated        04/14/24 0921   04/14/24 0000  Change dressing       Comments: Do not remove your dressing.   04/14/24 0921              WEIGHT BEARING   Weight bearing as tolerated with assist device (walker, cane, etc) as directed, use it as long as suggested by your surgeon or therapist, typically at least 4-6 weeks.   EXERCISES  Results after joint replacement surgery are often greatly improved when you follow the exercise, range of motion and muscle strengthening exercises prescribed by your doctor. Safety measures are also important to protect the joint from further injury. Any time any of these exercises cause you to have increased pain or swelling, decrease what you are doing until you are comfortable again and then slowly increase them. If you have problems or questions, call your caregiver or physical therapist for advice.   Rehabilitation is important following a joint replacement. After just a few days of immobilization, the muscles of the leg can become weakened and shrink (atrophy).  These exercises are designed to build up the tone and strength of the thigh and leg muscles and to  improve motion. Often times heat used for twenty to thirty minutes before working out will loosen up your tissues and help with improving the range of motion but do not use heat for the first two weeks following surgery (sometimes heat can increase post-operative swelling).   These exercises can be done on a training (exercise) mat, on the floor, on a table or on a bed. Use whatever works the  best and is most comfortable for you.    Use music or television while you are exercising so that the exercises are a pleasant break in your day. This will make your life better with the exercises acting as a break in your routine that you can look forward to.   Perform all exercises about fifteen times, three times per day or as directed.  You should exercise both the operative leg and the other leg as well.  Exercises include:   Quad Sets - Tighten up the muscle on the front of the thigh (Quad) and hold for 5-10 seconds.   Straight Leg Raises - With your knee straight (if you were given a brace, keep it on), lift the leg to 60 degrees, hold for 3 seconds, and slowly lower the leg.  Perform this exercise against resistance later as your leg gets stronger.  Leg Slides: Lying on your back, slowly slide your foot toward your buttocks, bending your knee up off the floor (only go as far as is comfortable). Then slowly slide your foot back down until your leg is flat on the floor again.  Angel Wings: Lying on your back spread your legs to the side as far apart as you can without causing discomfort.  Hamstring Strength:  Lying on your back, push your heel against the floor with your leg straight by tightening up the muscles of your buttocks.  Repeat, but this time bend your knee to a comfortable angle, and push your heel against the floor.  You may put a pillow under the heel to make it more comfortable if necessary.   A rehabilitation program following joint replacement surgery can speed recovery and prevent re-injury in  the future due to weakened muscles. Contact your doctor or a physical therapist for more information on knee rehabilitation.    CONSTIPATION  Constipation is defined medically as fewer than three stools per week and severe constipation as less than one stool per week.  Even if you have a regular bowel pattern at home, your normal regimen is likely to be disrupted due to multiple reasons following surgery.  Combination of anesthesia, postoperative narcotics, change in appetite and fluid intake all can affect your bowels.   YOU MUST use at least one of the following options; they are listed in order of increasing strength to get the job done.  They are all available over the counter, and you may need to use some, POSSIBLY even all of these options:    Drink plenty of fluids (prune juice may be helpful) and high fiber foods Colace 100 mg by mouth twice a day  Senokot for constipation as directed and as needed Dulcolax (bisacodyl ), take with full glass of water   Miralax  (polyethylene glycol) once or twice a day as needed.  If you have tried all these things and are unable to have a bowel movement in the first 3-4 days after surgery call either your surgeon or your primary doctor.    If you experience loose stools or diarrhea, hold the medications until you stool forms back up.  If your symptoms do not get better within 1 week or if they get worse, check with your doctor.  If you experience the worst abdominal pain ever or develop nausea or vomiting, please contact the office immediately for further recommendations for treatment.   ITCHING:  If you experience itching with your medications, try taking only a single pain pill, or even half a pain pill at a time.  You can also use Benadryl  over the counter for itching or also to help with sleep.   TED HOSE STOCKINGS:  Use stockings on both legs until for at least 2 weeks or as directed by physician office. They may be removed at night for  sleeping.  MEDICATIONS:  See your medication summary on the After Visit Summary that nursing will review with you.  You may have some home medications which will be placed on hold until you complete the course of blood thinner medication.  It is important for you to complete the blood thinner medication as prescribed.  PRECAUTIONS:  If you experience chest pain or shortness of breath - call 911 immediately for transfer to the hospital emergency department.   If you develop a fever greater that 101 F, purulent drainage from wound, increased redness or drainage from wound, foul odor from the wound/dressing, or calf pain - CONTACT YOUR SURGEON.                                                   FOLLOW-UP APPOINTMENTS:  If you do not already have a post-op appointment, please call the office for an appointment to be seen by your surgeon.  Guidelines for how soon to be seen are listed in your After Visit Summary, but are typically between 1-4 weeks after surgery.  OTHER INSTRUCTIONS:   Knee Replacement:  Do not place pillow under knee, focus on keeping the knee straight while resting. CPM instructions: 0-90 degrees, 2 hours in the morning, 2 hours in the afternoon, and 2 hours in the evening. Place foam block, curve side up under heel at all times except when in CPM or when walking.  DO NOT modify, tear, cut, or change the foam block in any way.   MAKE SURE YOU:  Understand these instructions.  Get help right away if you are not doing well or get worse.    Thank you for letting us  be a part of your medical care team.  It is a privilege we respect greatly.  We hope these instructions will help you stay on track for a fast and full recovery!   Diagnostic Studies: DG Knee Left Port Result Date: 04/13/2024 CLINICAL DATA:  Status post left knee replacement. EXAM: PORTABLE LEFT KNEE - 1-2 VIEW COMPARISON:  None Available. FINDINGS: The femoral, tibial and patellar components are well situated. Expected  postoperative changes are seen in the soft tissues anteriorly. IMPRESSION: Status post left total knee arthroplasty. Electronically Signed   By: Lynwood Landy Raddle M.D.   On: 04/13/2024 15:52    Disposition: Discharge disposition: 01-Home or Self Care       Discharge Instructions     Call MD / Call 911   Complete by: As directed    If you experience chest pain or shortness of breath, CALL 911 and be transported to the hospital emergency room.  If you develope a fever above 101 F, pus (white drainage) or increased drainage or redness at the wound, or calf pain, call your surgeon's office.   Change dressing   Complete by: As directed    Do not remove your dressing.   Constipation Prevention   Complete by: As directed    Drink plenty of fluids.  Prune juice may be helpful.  You may use a stool softener, such as Colace (  over the counter) 100 mg twice a day.  Use MiraLax  (over the counter) for constipation as needed.   Diet Carb Modified   Complete by: As directed    Discharge instructions   Complete by: As directed    Elevate toes above nose. Use cryotherapy as needed for pain and swelling.   Do not put a pillow under the knee. Place it under the heel.   Complete by: As directed    Driving restrictions   Complete by: As directed    No driving for 6 weeks   Increase activity slowly as tolerated   Complete by: As directed    Lifting restrictions   Complete by: As directed    No lifting for 6 weeks   Post-operative opioid taper instructions:   Complete by: As directed    POST-OPERATIVE OPIOID TAPER INSTRUCTIONS: It is important to wean off of your opioid medication as soon as possible. If you do not need pain medication after your surgery it is ok to stop day one. Opioids include: Codeine, Hydrocodone (Norco, Vicodin), Oxycodone (Percocet, oxycontin ) and hydromorphone  amongst others.  Long term and even short term use of opiods can cause: Increased pain  response Dependence Constipation Depression Respiratory depression And more.  Withdrawal symptoms can include Flu like symptoms Nausea, vomiting And more Techniques to manage these symptoms Hydrate well Eat regular healthy meals Stay active Use relaxation techniques(deep breathing, meditating, yoga) Do Not substitute Alcohol  to help with tapering If you have been on opioids for less than two weeks and do not have pain than it is ok to stop all together.  Plan to wean off of opioids This plan should start within one week post op of your joint replacement. Maintain the same interval or time between taking each dose and first decrease the dose.  Cut the total daily intake of opioids by one tablet each day Next start to increase the time between doses. The last dose that should be eliminated is the evening dose.      TED hose   Complete by: As directed    Use stockings (TED hose) for 2 weeks on both leg(s).  You may remove them at night for sleeping.   Weight bearing as tolerated   Complete by: As directed         Follow-up Information     Leigh Valery RAMAN, PA-C. Schedule an appointment as soon as possible for a visit in 2 week(s).   Specialty: Orthopedic Surgery Why: For suture removal, For wound re-check Contact information: [address], [address] [place] [address] [phone]                  Signed: Valery RAMAN Leigh 04/14/2024, 9:21 AM  "

## 2024-04-14 NOTE — Progress Notes (Signed)
 Physical Therapy Treatment Patient Details Name: Brandon Park MRN: [id number] DOB: 1950-11-18 Today's Date: 04/14/2024   History of Present Illness Pt s/p L TKR and with hx of R TKR, DM, and glaucoma    PT Comments  Pt continues motivated and progressing well with mobility.  Pt up to ambulate in hall, negotiated stairs and reviewed written HEP.  Pt eager for dc home this date.    If plan is discharge home, recommend the following: A little help with walking and/or transfers;A little help with bathing/dressing/bathroom;Assistance with cooking/housework;Assist for transportation;Help with stairs or ramp for entrance   Can travel by private vehicle        Equipment Recommendations  None recommended by PT    Recommendations for Other Services       Precautions / Restrictions Precautions Precautions: Knee;Fall Restrictions Weight Bearing Restrictions Per Provider Order: Yes LLE Weight Bearing Per Provider Order: Weight bearing as tolerated     Mobility  Bed Mobility Overal bed mobility: Needs Assistance Bed Mobility: Supine to Sit     Supine to sit: Supervision     General bed mobility comments: Up in chair and returns to same    Transfers Overall transfer level: Needs assistance Equipment used: Rolling walker (2 wheels) Transfers: Sit to/from Stand Sit to Stand: Supervision           General transfer comment: cues for LE management and use of UEs to self assist    Ambulation/Gait Ambulation/Gait assistance: Supervision Gait Distance (Feet): 75 Feet Assistive device: Rolling walker (2 wheels) Gait Pattern/deviations: Step-to pattern, Step-through pattern, Decreased step length - right, Decreased step length - left, Shuffle, Trunk flexed Gait velocity: decr     General Gait Details: min cues for posture, position from RW and initial sequence   Stairs Stairs: Yes Stairs assistance: Min assist Stair Management: No rails, Step to pattern, Backwards, With  walker Number of Stairs: 2 General stair comments: cues for sequence (Pt declines to attempt 2nd time)   Wheelchair Mobility     Tilt Bed    Modified Rankin (Stroke Patients Only)       Balance Overall balance assessment: Mild deficits observed, not formally tested                                          Communication Communication Communication: No apparent difficulties  Cognition Arousal: Alert Behavior During Therapy: WFL for tasks assessed/performed   PT - Cognitive impairments: No apparent impairments                         Following commands: Intact      Cueing Cueing Techniques: Verbal cues  Exercises Total Joint Exercises Ankle Circles/Pumps: AROM, Both, 10 reps, Supine Quad Sets: AROM, Both, 10 reps, Supine Heel Slides: AAROM, Left, 15 reps, Supine Straight Leg Raises: AAROM, AROM, Left, 15 reps, Supine Long Arc Quad: AAROM, Left, 10 reps, Seated    General Comments        Pertinent Vitals/Pain Pain Assessment Pain Assessment: 0-10 Pain Score: 5  Pain Location: L knee Pain Descriptors / Indicators: Aching, Sore Pain Intervention(s): Limited activity within patient's tolerance, Monitored during session, Premedicated before session, Ice applied    Home Living Family/patient expects to be discharged to:: Private residence Living Arrangements: Alone Available Help at Discharge: Family;Available 24 hours/day Type of Home: House Home Access: Stairs to  enter Entrance Stairs-Rails: None Entrance Stairs-Number of Steps: 2   Home Layout: One level Home Equipment: Shower seat;Grab bars - tub/shower;Rolling Walker (2 wheels);Cane - single point      Prior Function            PT Goals (current goals can now be found in the care plan section) Acute Rehab PT Goals Patient Stated Goal: Regain IND PT Goal Formulation: With patient Time For Goal Achievement: 04/21/24 Potential to Achieve Goals: Good Progress towards PT  goals: Progressing toward goals    Frequency    7X/week      PT Plan      Co-evaluation              AM-PAC PT 6 Clicks Mobility   Outcome Measure  Help needed turning from your back to your side while in a flat bed without using bedrails?: A Little Help needed moving from lying on your back to sitting on the side of a flat bed without using bedrails?: A Little Help needed moving to and from a bed to a chair (including a wheelchair)?: A Little Help needed standing up from a chair using your arms (e.g., wheelchair or bedside chair)?: A Little Help needed to walk in hospital room?: A Little Help needed climbing 3-5 steps with a railing? : A Little 6 Click Score: 18    End of Session Equipment Utilized During Treatment: Gait belt Activity Tolerance: Patient tolerated treatment well Patient left: in chair;with call bell/phone within reach;with chair alarm set;with nursing/sitter in room Nurse Communication: Mobility status PT Visit Diagnosis: Difficulty in walking, not elsewhere classified (R26.2)     Time: 8961-8947 PT Time Calculation (min) (ACUTE ONLY): 14 min  Charges:    $Gait Training: 8-22 mins $Therapeutic Exercise: 8-22 mins PT General Charges $$ ACUTE PT VISIT: 1 Visit                     [facility] PT Acute Rehabilitation Services Office [phone]    Cem Kosman 04/14/2024, 11:11 AM

## 2024-04-14 NOTE — TOC Transition Note (Signed)
 Transition of Care [facility]) - Discharge Note   Patient Details  Name: Orlondo Holycross MRN: [id number] Date of Birth: [birth year]  Transition of [facility] CM/SW Contact:  NORMAN ASPEN, LCSW Phone Number: 04/14/2024, 9:32 AM   Clinical Narrative:     Met with pt who confirms he has needed DME in the home.  OPPT already arranged in [place].  No further IP CM needs.  Final next level of care: OP Rehab Barriers to Discharge: No Barriers Identified   Patient Goals and CMS Choice Patient states their goals for this hospitalization and ongoing recovery are:: return home          Discharge Placement                       Discharge Plan and Services Additional resources added to the After Visit Summary for                  DME Arranged: N/A DME Agency: NA                  Social Drivers of Health (SDOH) Interventions SDOH Screenings   Food Insecurity: No Food Insecurity (04/13/2024)  Housing: Low Risk (04/13/2024)  Transportation Needs: No Transportation Needs (04/13/2024)  Utilities: Not At Risk (04/13/2024)  Social Connections: Unknown (04/13/2024)  Tobacco Use: Medium Risk (04/13/2024)     Readmission Risk Interventions     No data to display

## 2024-04-14 NOTE — Progress Notes (Addendum)
" ° ° °  Subjective:  Patient reports pain as mild to moderate.  Denies N/V/CP/SOB/Abd pain.  Catheter removed this am, void pending.  Denies tingling or numbness in LE bilaterally.  He is eager for d/c home this date.  He reports he has zofran  already at home and does not need new prescription. He will let us  know if this changes.   Objective:   VITALS:   Vitals:   04/13/24 1836 04/13/24 2224 04/14/24 0127 04/14/24 0516  BP: 137/81 (!) 145/82 136/77 137/83  Pulse: 85 86 87 84  Resp: 16 18 16 18   Temp: 98 F (36.7 C) 97.8 F (36.6 C) 98.1 F (36.7 C) 98.1 F (36.7 C)  TempSrc: Oral Oral Oral Oral  SpO2: 97% 95% 94% 96%  Weight:      Height:        NAD Neurologically intact ABD soft Neurovascular intact Sensation intact distally Intact pulses distally Dorsiflexion/Plantar flexion intact Incision: dressing C/D/I No cellulitis present Compartment soft   Lab Results  Component Value Date   WBC 16.4 (H) 04/14/2024   HGB 11.9 (L) 04/14/2024   HCT 36.1 (L) 04/14/2024   MCV 85.7 04/14/2024   PLT 232 04/14/2024   BMET    Component Value Date/Time   NA 141 04/14/2024 0322   K 4.0 04/14/2024 0322   CL 107 04/14/2024 0322   CO2 23 04/14/2024 0322   GLUCOSE 275 (H) 04/14/2024 0322   BUN 17 04/14/2024 0322   CREATININE 1.19 04/14/2024 0322   CALCIUM  9.0 04/14/2024 0322   GFRNONAA >60 04/14/2024 0322     Assessment/Plan: 1 Day Post-Op   Principal Problem:   Osteoarthritis of left knee Active Problems:   S/P total knee arthroplasty, left  ABLA. Hemoglobin 11.9. Asymptomatic, continue to monitor.   WBAT with walker DVT ppx: Aspirin , SCDs, TEDS PO pain control PT/OT: To come today. Dispo:  - D/c home with OPPT once cleared with PT and voided.    Valery GORMAN Potters 04/14/2024, 9:16 AM   [facility]  [place] [address], [address], [place], [place] [address] Phone: [phone] [email or web address] Facebook  [employer]        "

## 2024-04-14 NOTE — Evaluation (Signed)
 Physical Therapy Evaluation Patient Details Name: Brandon Park MRN: [id number] DOB: Jul 27, 1950 Today's Date: 04/14/2024  History of Present Illness  Pt s/p L TKR and with hx of R TKR, DM, and glaucoma  Clinical Impression  Pt s/p L TKR and presents with decreased L LE strength/ROM and post op pain limiting functional mobility.  Pt should progress to dc home with assist of family/friends and reports first OP PT scheduled for 04/18/24.        If plan is discharge home, recommend the following: A little help with walking and/or transfers;A little help with bathing/dressing/bathroom;Assistance with cooking/housework;Assist for transportation;Help with stairs or ramp for entrance   Can travel by private vehicle        Equipment Recommendations None recommended by PT  Recommendations for Other Services       Functional Status Assessment Patient has had a recent decline in their functional status and demonstrates the ability to make significant improvements in function in a reasonable and predictable amount of time.     Precautions / Restrictions Precautions Precautions: Knee;Fall Restrictions Weight Bearing Restrictions Per Provider Order: Yes LLE Weight Bearing Per Provider Order: Weight bearing as tolerated      Mobility  Bed Mobility Overal bed mobility: Needs Assistance Bed Mobility: Supine to Sit     Supine to sit: Supervision     General bed mobility comments: use of gait belt to self assist L LE    Transfers Overall transfer level: Needs assistance Equipment used: Rolling walker (2 wheels) Transfers: Sit to/from Stand Sit to Stand: Contact guard assist           General transfer comment: cues for LE management and use of UEs to self assist    Ambulation/Gait Ambulation/Gait assistance: Contact guard assist Gait Distance (Feet): 135 Feet Assistive device: Rolling walker (2 wheels) Gait Pattern/deviations: Step-to pattern, Step-through pattern, Decreased  step length - right, Decreased step length - left, Shuffle, Trunk flexed Gait velocity: decr     General Gait Details: cues for posture, position from RW and initial sequence  Stairs            Wheelchair Mobility     Tilt Bed    Modified Rankin (Stroke Patients Only)       Balance Overall balance assessment: Mild deficits observed, not formally tested                                           Pertinent Vitals/Pain Pain Assessment Pain Assessment: 0-10 Pain Score: 4  Pain Location: L knee Pain Descriptors / Indicators: Aching, Sore Pain Intervention(s): Limited activity within patient's tolerance, Monitored during session, Premedicated before session, Ice applied    Home Living Family/patient expects to be discharged to:: Private residence Living Arrangements: Alone Available Help at Discharge: Family;Available 24 hours/day Type of Home: House Home Access: Stairs to enter Entrance Stairs-Rails: None Entrance Stairs-Number of Steps: 2   Home Layout: One level Home Equipment: Shower seat;Grab bars - tub/shower;Rolling Walker (2 wheels);Cane - single point      Prior Function Prior Level of Function : Independent/Modified Independent;Working/employed;Driving             Mobility Comments: No AD to ambulate       Extremity/Trunk Assessment   Upper Extremity Assessment Upper Extremity Assessment: Overall WFL for tasks assessed    Lower Extremity Assessment Lower Extremity Assessment: LLE deficits/detail LLE  Deficits / Details: 3-/5 quads with AAROM at knee - 5 - 60    Cervical / Trunk Assessment Cervical / Trunk Assessment: Normal  Communication   Communication Communication: No apparent difficulties    Cognition Arousal: Alert Behavior During Therapy: WFL for tasks assessed/performed   PT - Cognitive impairments: No apparent impairments                         Following commands: Intact       Cueing  Cueing Techniques: Verbal cues     General Comments      Exercises Total Joint Exercises Ankle Circles/Pumps: AROM, Both, 10 reps, Supine Quad Sets: AROM, Both, 10 reps, Supine Heel Slides: AAROM, Left, 15 reps, Supine Straight Leg Raises: AAROM, AROM, Left, 15 reps, Supine Long Arc Quad: AAROM, Left, 10 reps, Seated   Assessment/Plan    PT Assessment Patient needs continued PT services  PT Problem List Decreased strength;Decreased range of motion;Decreased activity tolerance;Decreased balance;Decreased mobility;Decreased knowledge of use of DME;Pain       PT Treatment Interventions DME instruction;Gait training;Stair training;Functional mobility training;Therapeutic activities;Therapeutic exercise;Patient/family education    PT Goals (Current goals can be found in the Care Plan section)  Acute Rehab PT Goals Patient Stated Goal: Regain IND PT Goal Formulation: With patient Time For Goal Achievement: 04/21/24 Potential to Achieve Goals: Good    Frequency 7X/week     Co-evaluation               AM-PAC PT 6 Clicks Mobility  Outcome Measure Help needed turning from your back to your side while in a flat bed without using bedrails?: A Little Help needed moving from lying on your back to sitting on the side of a flat bed without using bedrails?: A Little Help needed moving to and from a bed to a chair (including a wheelchair)?: A Little Help needed standing up from a chair using your arms (e.g., wheelchair or bedside chair)?: A Little Help needed to walk in hospital room?: A Little Help needed climbing 3-5 steps with a railing? : A Little 6 Click Score: 18    End of Session Equipment Utilized During Treatment: Gait belt Activity Tolerance: Patient tolerated treatment well Patient left: in chair;with call bell/phone within reach;with chair alarm set;with nursing/sitter in room Nurse Communication: Mobility status PT Visit Diagnosis: Difficulty in walking, not  elsewhere classified (R26.2)    Time: 9069-9042 PT Time Calculation (min) (ACUTE ONLY): 27 min   Charges:   PT Evaluation $PT Eval Low Complexity: 1 Low PT Treatments $Therapeutic Exercise: 8-22 mins PT General Charges $$ ACUTE PT VISIT: 1 Visit         [facility] PT Acute Rehabilitation Services Office [phone]   Anamae Rochelle 04/14/2024, 11:04 AM

## 2024-04-14 NOTE — Progress Notes (Signed)
 Discharge Medications delivered from TOC meds to bed [facility] outpatient pharmacy by this RN.

## 2024-04-20 ENCOUNTER — Encounter (HOSPITAL_COMMUNITY): Payer: Self-pay | Admitting: Orthopedic Surgery
